# Patient Record
Sex: Male | Born: 1960 | Race: White | Hispanic: No | Marital: Married | Smoking: Never smoker
Health system: Southern US, Community
[De-identification: ages and names within clinical notes are randomized; demographics above are authoritative.]

## PROBLEM LIST (undated history)

## (undated) DIAGNOSIS — J45909 Unspecified asthma, uncomplicated: Secondary | ICD-10-CM

## (undated) DIAGNOSIS — E559 Vitamin D deficiency, unspecified: Secondary | ICD-10-CM

## (undated) DIAGNOSIS — J302 Other seasonal allergic rhinitis: Secondary | ICD-10-CM

## (undated) DIAGNOSIS — K219 Gastro-esophageal reflux disease without esophagitis: Secondary | ICD-10-CM

## (undated) DIAGNOSIS — R972 Elevated prostate specific antigen [PSA]: Secondary | ICD-10-CM

## (undated) DIAGNOSIS — T7840XA Allergy, unspecified, initial encounter: Secondary | ICD-10-CM

## (undated) HISTORY — DX: Allergy, unspecified, initial encounter: T78.40XA

## (undated) HISTORY — DX: Vitamin D deficiency, unspecified: E55.9

## (undated) HISTORY — DX: Elevated prostate specific antigen (PSA): R97.20

---
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
## 2007-05-23 ENCOUNTER — Ambulatory Visit: Payer: Self-pay | Admitting: Gastroenterology

## 2008-08-31 ENCOUNTER — Emergency Department: Payer: Self-pay | Admitting: Emergency Medicine

## 2011-12-07 ENCOUNTER — Ambulatory Visit: Payer: Self-pay

## 2011-12-07 LAB — RAPID INFLUENZA A&B ANTIGENS

## 2014-03-23 ENCOUNTER — Ambulatory Visit: Payer: Self-pay | Admitting: Gastroenterology

## 2014-12-23 ENCOUNTER — Ambulatory Visit
Admit: 2014-12-23 | Disposition: A | Discharge status: Home / Self Care | Payer: Self-pay | Attending: Family Medicine | Admitting: Family Medicine

## 2015-02-25 ENCOUNTER — Other Ambulatory Visit: Payer: Managed Care, Other (non HMO)

## 2015-02-25 ENCOUNTER — Other Ambulatory Visit: Payer: Self-pay | Admitting: Family Medicine

## 2015-02-25 DIAGNOSIS — E785 Hyperlipidemia, unspecified: Secondary | ICD-10-CM

## 2015-02-25 DIAGNOSIS — E559 Vitamin D deficiency, unspecified: Secondary | ICD-10-CM

## 2015-02-25 DIAGNOSIS — R972 Elevated prostate specific antigen [PSA]: Secondary | ICD-10-CM

## 2015-02-25 HISTORY — DX: Elevated prostate specific antigen (PSA): R97.20

## 2015-02-25 HISTORY — DX: Vitamin D deficiency, unspecified: E55.9

## 2015-02-26 ENCOUNTER — Encounter: Payer: Self-pay | Admitting: Family Medicine

## 2015-02-26 LAB — LIPID PANEL W/O CHOL/HDL RATIO
Cholesterol, Total: 195 mg/dL (ref 100–199)
HDL: 48 mg/dL (ref >39)
LDL Calculated: 129 mg/dL — ABNORMAL HIGH (ref 0–99)
Triglycerides: 90 mg/dL (ref 0–149)
VLDL CHOLESTEROL CAL: 18 mg/dL (ref 5–40)

## 2015-02-26 LAB — PSA: Prostate Specific Ag, Serum: 2.1 ng/mL (ref 0.0–4.0)

## 2015-02-26 LAB — VITAMIN D 25 HYDROXY (VIT D DEFICIENCY, FRACTURES): Vit D, 25-Hydroxy: 40.2 ng/mL (ref 30.0–100.0)

## 2016-08-07 ENCOUNTER — Encounter: Payer: Managed Care, Other (non HMO) | Admitting: Family Medicine

## 2016-09-07 ENCOUNTER — Encounter: Payer: Self-pay | Admitting: Family Medicine

## 2016-09-07 ENCOUNTER — Ambulatory Visit (INDEPENDENT_AMBULATORY_CARE_PROVIDER_SITE_OTHER): Payer: Managed Care, Other (non HMO) | Admitting: Family Medicine

## 2016-09-07 VITALS — BP 104/62 | HR 86 | Temp 97.6°F | Resp 14 | Ht 66.7 in | Wt 139.0 lb

## 2016-09-07 DIAGNOSIS — Z1159 Encounter for screening for other viral diseases: Secondary | ICD-10-CM

## 2016-09-07 DIAGNOSIS — Z0000 Encounter for general adult medical examination without abnormal findings: Secondary | ICD-10-CM | POA: Diagnosis not present

## 2016-09-07 DIAGNOSIS — E559 Vitamin D deficiency, unspecified: Secondary | ICD-10-CM | POA: Diagnosis not present

## 2016-09-07 DIAGNOSIS — R972 Elevated prostate specific antigen [PSA]: Secondary | ICD-10-CM | POA: Diagnosis not present

## 2016-09-07 DIAGNOSIS — Z114 Encounter for screening for human immunodeficiency virus [HIV]: Secondary | ICD-10-CM

## 2016-09-07 LAB — CBC WITH DIFFERENTIAL/PLATELET
BASOS PCT: 0 %
Basophils Absolute: 0 cells/uL (ref 0–200)
EOS ABS: 228 {cells}/uL (ref 15–500)
Eosinophils Relative: 3 %
HCT: 50.9 % — ABNORMAL HIGH (ref 38.5–50.0)
Hemoglobin: 17.2 g/dL — ABNORMAL HIGH (ref 13.2–17.1)
LYMPHS PCT: 19 %
Lymphs Abs: 1444 cells/uL (ref 850–3900)
MCH: 30.6 pg (ref 27.0–33.0)
MCHC: 33.8 g/dL (ref 32.0–36.0)
MCV: 90.4 fL (ref 80.0–100.0)
MONOS PCT: 5 %
MPV: 9.3 fL (ref 7.5–12.5)
Monocytes Absolute: 380 cells/uL (ref 200–950)
Neutro Abs: 5548 cells/uL (ref 1500–7800)
Neutrophils Relative %: 73 %
PLATELETS: 270 10*3/uL (ref 140–400)
RBC: 5.63 MIL/uL (ref 4.20–5.80)
RDW: 13.5 % (ref 11.0–15.0)
WBC: 7.6 10*3/uL (ref 3.8–10.8)

## 2016-09-07 NOTE — Patient Instructions (Addendum)
We'll get labs today If you have not heard anything from my staff in a week about any orders/referrals/studies from today, please contact us here to follow-up ([phone]) [phone]  Health Maintenance, Male A healthy lifestyle and preventative care can promote health and wellness.  Maintain regular health, dental, and eye exams.  Eat a healthy diet. Foods like vegetables, fruits, whole grains, low-fat dairy products, and lean protein foods contain the nutrients you need and are low in calories. Decrease your intake of foods high in solid fats, added sugars, and salt. Get information about a proper diet from your health care provider, if necessary.  Regular physical exercise is one of the most important things you can do for your health. Most adults should get at least 150 minutes of moderate-intensity exercise (any activity that increases your heart rate and causes you to sweat) each week. In addition, most adults need muscle-strengthening exercises on 2 or more days a week.   Maintain a healthy weight. The body mass index (BMI) is a screening tool to identify possible weight problems. It provides an estimate of body fat based on height and weight. Your health care provider can find your BMI and can help you achieve or maintain a healthy weight. For males 20 years and older:  A BMI below 18.5 is considered underweight.  A BMI of 18.5 to 24.9 is normal.  A BMI of 25 to 29.9 is considered overweight.  A BMI of 30 and above is considered obese.  Maintain normal blood lipids and cholesterol by exercising and minimizing your intake of saturated fat. Eat a balanced diet with plenty of fruits and vegetables. Blood tests for lipids and cholesterol should begin at age 38 and be repeated every 5 years. If your lipid or cholesterol levels are high, you are over age 90, or you are at high risk for heart disease, you may need your cholesterol levels checked more frequently.Ongoing high lipid and cholesterol  levels should be treated with medicines if diet and exercise are not working.  If you smoke, find out from your health care provider how to quit. If you do not use tobacco, do not start.  Lung cancer screening is recommended for adults aged 22-80 years who are at high risk for developing lung cancer because of a history of smoking. A yearly low-dose CT scan of the lungs is recommended for people who have at least a 30-pack-year history of smoking and are current smokers or have quit within the past 15 years. A pack year of smoking is smoking an average of 1 pack of cigarettes a day for 1 year (for example, a 30-pack-year history of smoking could mean smoking 1 pack a day for 30 years or 2 packs a day for 15 years). Yearly screening should continue until the smoker has stopped smoking for at least 15 years. Yearly screening should be stopped for people who develop a health problem that would prevent them from having lung cancer treatment.  If you choose to drink alcohol, do not have more than 2 drinks per day. One drink is considered to be 12 oz (360 mL) of beer, 5 oz (150 mL) of wine, or 1.5 oz (45 mL) of liquor.  Avoid the use of street drugs. Do not share needles with anyone. Ask for help if you need support or instructions about stopping the use of drugs.  High blood pressure causes heart disease and increases the risk of stroke. High blood pressure is more likely to develop in:  People who have blood pressure in the end of the normal range (100-139/85-89 mm Hg).  People who are overweight or obese.  People who are African American.  If you are 22-42 years of age, have your blood pressure checked every 3-5 years. If you are 78 years of age or older, have your blood pressure checked every year. You should have your blood pressure measured twice-once when you are at a hospital or clinic, and once when you are not at a hospital or clinic. Record the average of the two measurements. To check your  blood pressure when you are not at a hospital or clinic, you can use:  An automated blood pressure machine at a pharmacy.  A home blood pressure monitor.  If you are 46-41 years old, ask your health care provider if you should take aspirin to prevent heart disease.  Diabetes screening involves taking a blood sample to check your fasting blood sugar level. This should be done once every 3 years after age 59 if you are at a normal weight and without risk factors for diabetes. Testing should be considered at a younger age or be carried out more frequently if you are overweight and have at least 1 risk factor for diabetes.  Colorectal cancer can be detected and often prevented. Most routine colorectal cancer screening begins at the age of 34 and continues through age 1. However, your health care provider may recommend screening at an earlier age if you have risk factors for colon cancer. On a yearly basis, your health care provider may provide home test kits to check for hidden blood in the stool. A small camera at the end of a tube may be used to directly examine the colon (sigmoidoscopy or colonoscopy) to detect the earliest forms of colorectal cancer. Talk to your health care provider about this at age 34 when routine screening begins. A direct exam of the colon should be repeated every 5-10 years through age 52, unless early forms of precancerous polyps or small growths are found.  People who are at an increased risk for hepatitis B should be screened for this virus. You are considered at high risk for hepatitis B if:  You were born in a country where hepatitis B occurs often. Talk with your health care provider about which countries are considered high risk.  Your parents were born in a high-risk country and you have not received a shot to protect against hepatitis B (hepatitis B vaccine).  You have HIV or AIDS.  You use needles to inject street drugs.  You live with, or have sex with,  someone who has hepatitis B.  You are a man who has sex with other men (MSM).  You get hemodialysis treatment.  You take certain medicines for conditions like cancer, organ transplantation, and autoimmune conditions.  Hepatitis C blood testing is recommended for all people born from [birth year] through 1963 and any individual with known risk factors for hepatitis C.  Healthy men should no longer receive prostate-specific antigen (PSA) blood tests as part of routine cancer screening. Talk to your health care provider about prostate cancer screening.  Testicular cancer screening is not recommended for adolescents or adult males who have no symptoms. Screening includes self-exam, a health care provider exam, and other screening tests. Consult with your health care provider about any symptoms you have or any concerns you have about testicular cancer.  Practice safe sex. Use condoms and avoid high-risk sexual practices to reduce the spread of  sexually transmitted infections (STIs).  You should be screened for STIs, including gonorrhea and chlamydia if:  You are sexually active and are younger than 24 years.  You are older than 24 years, and your health care provider tells you that you are at risk for this type of infection.  Your sexual activity has changed since you were last screened, and you are at an increased risk for chlamydia or gonorrhea. Ask your health care provider if you are at risk.  If you are at risk of being infected with HIV, it is recommended that you take a prescription medicine daily to prevent HIV infection. This is called pre-exposure prophylaxis (PrEP). You are considered at risk if:  You are a man who has sex with other men (MSM).  You are a heterosexual man who is sexually active with multiple partners.  You take drugs by injection.  You are sexually active with a partner who has HIV.  Talk with your health care provider about whether you are at high risk of being  infected with HIV. If you choose to begin PrEP, you should first be tested for HIV. You should then be tested every 3 months for as long as you are taking PrEP.  Use sunscreen. Apply sunscreen liberally and repeatedly throughout the day. You should seek shade when your shadow is shorter than you. Protect yourself by wearing long sleeves, pants, a wide-brimmed hat, and sunglasses year round whenever you are outdoors.  Tell your health care provider of new moles or changes in moles, especially if there is a change in shape or color. Also, tell your health care provider if a mole is larger than the size of a pencil eraser.  A one-time screening for abdominal aortic aneurysm (AAA) and surgical repair of large AAAs by ultrasound is recommended for men aged 65-75 years who are current or former smokers.  Stay current with your vaccines (immunizations). This information is not intended to replace advice given to you by your health care provider. Make sure you discuss any questions you have with your health care provider. Document Released: 02/13/2008 Document Revised: 09/07/2014 Document Reviewed: 05/21/2015 Elsevier Interactive Patient Education  2017 [employer].

## 2016-09-07 NOTE — Assessment & Plan Note (Signed)
Check level 

## 2016-09-07 NOTE — Assessment & Plan Note (Signed)
Order PSA.

## 2016-09-07 NOTE — Assessment & Plan Note (Signed)
USPSTF grade A and B recommendations reviewed with patient; age-appropriate recommendations, preventive care, screening tests, etc discussed and encouraged; healthy living encouraged; see AVS for patient education given to patient  

## 2016-09-07 NOTE — Progress Notes (Signed)
Patient ID: Brandon Howell, male   DOB: 1961/04/30, 56 y.o.   MRN: [id number]   Subjective:   Brandon Howell is a 56 y.o. male here for a complete physical exam  Interim issues since last visit: had colonoscopy in 2015  USPSTF grade A and B recommendations Alcohol: moderation, under 14 Depression:  Depression screen [facility] 2/9 09/07/2016  Decreased Interest 0  Down, Depressed, Hopeless 0  PHQ - 2 Score 0   Hypertension: excellent control Obesity: no Tobacco use: never smoker HIV, hep B, hep C: ordered STD testing and prevention (chl/gon/syphilis):  Lipids: today Glucose: today Colorectal cancer: done in 2015 Breast cancer: no lumps Lung cancer: n/a Osteoporosis: n/a AAA: n/a  Aspirin: not taking but will consider; has them, never took them Diet: pretty good eater; avoid processed foods, no fast foods; no sodas Exercise: nothing regular, will try to increase Skin cancer: nothing worrisome  Cyst on left hip; superficial; had one on right knee that was removed by [facility], Dr. Nehemiah Massed; not bleeding or draining  Past Medical History:  Diagnosis Date  . Allergy   . PSA elevation 02/25/2015  . Vitamin D deficiency 02/25/2015   Past Surgical History:  Procedure Laterality Date  . EYE SURGERY Bilateral 08/31/1965   Family History  Problem Relation Age of Onset  . Adopted: Yes  . Thyroid disease Mother   . Hyperlipidemia Mother   . Allergic rhinitis Brother   . Allergic rhinitis Brother    Social History  Substance Use Topics  . Smoking status: Never Smoker  . Smokeless tobacco: Former Systems developer  . Alcohol use Yes     Comment: moderate, less than 14 per week   Review of Systems  Respiratory: Negative for shortness of breath.   Cardiovascular: Negative for chest pain.  Gastrointestinal: Negative for blood in stool.  Genitourinary: Negative for hematuria.    Objective:   Vitals:   09/07/16 0933  BP: 104/62  Pulse: 86  Resp: 14  Temp: 97.6 F (36.4  C)  TempSrc: Oral  SpO2: 96%  Weight: 139 lb (63 kg)  Height: 5' 6.7" (1.694 m)   Body mass index is 21.97 kg/m. Wt Readings from Last 3 Encounters:  09/07/16 139 lb (63 kg)   Physical Exam  Constitutional: He appears well-developed and well-nourished. No distress.  HENT:  Head: Normocephalic and atraumatic.  Nose: Nose normal.  Mouth/Throat: Oropharynx is clear and moist.  Eyes: EOM are normal. No scleral icterus.  Neck: No JVD present. No thyromegaly present.  Cardiovascular: Normal rate, regular rhythm and normal heart sounds.   Pulmonary/Chest: Effort normal and breath sounds normal. No respiratory distress. He has no wheezes. He has no rales.  Abdominal: Soft. Bowel sounds are normal. He exhibits no distension. There is no tenderness. There is no guarding.  Musculoskeletal: Normal range of motion. He exhibits no edema.  Lymphadenopathy:    He has no cervical adenopathy.  Neurological: He is alert. He displays normal reflexes. He exhibits normal muscle tone. Coordination normal.  Skin: Skin is warm and dry. He is not diaphoretic. No pallor.     Very soft papule lateral aspect of LEFT hip; consistency of lipoma; no overlying skin changes; about 1 cm across in diameter  Psychiatric: He has a normal mood and affect. His behavior is normal. Judgment and thought content normal. His mood appears not anxious. He does not exhibit a depressed mood.   Assessment/Plan:   Problem List Items Addressed This Visit  Other   Vitamin D deficiency    Check level      Relevant Orders   VITAMIN D 25 Hydroxy (Vit-D Deficiency, Fractures)   PSA elevation    Order PSA      Relevant Orders   PSA   Preventative health care    USPSTF grade A and B recommendations reviewed with patient; age-appropriate recommendations, preventive care, screening tests, etc discussed and encouraged; healthy living encouraged; see AVS for patient education given to patient       Relevant Orders    Lipid panel   CBC with Differential/Platelet   COMPLETE METABOLIC PANEL WITH GFR    Other Visit Diagnoses    Screening for HIV without presence of risk factors    -  Primary   Relevant Orders   HIV antibody (with reflex)   Encounter for hepatitis C screening test for low risk patient       Relevant Orders   Hepatitis C Antibody      No orders of the defined types were placed in this encounter.  Orders Placed This Encounter  Procedures  . HIV antibody (with reflex)  . Hepatitis C Antibody  . Lipid panel  . CBC with Differential/Platelet  . PSA  . VITAMIN D 25 Hydroxy (Vit-D Deficiency, Fractures)  . COMPLETE METABOLIC PANEL WITH GFR    Follow up plan: Return in about 1 year (around 09/07/2017) for complete physical.  An After Visit Summary was printed and given to the patient.

## 2016-09-08 LAB — COMPLETE METABOLIC PANEL WITH GFR
ALBUMIN: 4.7 g/dL (ref 3.6–5.1)
ALK PHOS: 65 U/L (ref 40–115)
ALT: 48 U/L — ABNORMAL HIGH (ref 9–46)
AST: 30 U/L (ref 10–35)
BUN: 18 mg/dL (ref 7–25)
CALCIUM: 9.2 mg/dL (ref 8.6–10.3)
CO2: 27 mmol/L (ref 20–31)
Chloride: 103 mmol/L (ref 98–110)
Creat: 0.9 mg/dL (ref 0.70–1.33)
GLUCOSE: 80 mg/dL (ref 65–99)
POTASSIUM: 4.3 mmol/L (ref 3.5–5.3)
SODIUM: 142 mmol/L (ref 135–146)
Total Bilirubin: 0.5 mg/dL (ref 0.2–1.2)
Total Protein: 7.4 g/dL (ref 6.1–8.1)

## 2016-09-08 LAB — LIPID PANEL
CHOL/HDL RATIO: 4.1 ratio (ref <5.0)
CHOLESTEROL: 224 mg/dL — AB (ref <200)
HDL: 55 mg/dL (ref >40)
LDL Cholesterol: 149 mg/dL — ABNORMAL HIGH (ref <100)
Triglycerides: 101 mg/dL (ref <150)
VLDL: 20 mg/dL (ref <30)

## 2016-09-08 LAB — PSA: PSA: 0.8 ng/mL (ref <=4.0)

## 2016-09-08 LAB — VITAMIN D 25 HYDROXY (VIT D DEFICIENCY, FRACTURES): Vit D, 25-Hydroxy: 52 ng/mL (ref 30–100)

## 2016-09-08 LAB — HIV ANTIBODY (ROUTINE TESTING W REFLEX): HIV 1&2 Ab, 4th Generation: NONREACTIVE

## 2016-09-08 LAB — HEPATITIS C ANTIBODY: HCV Ab: NEGATIVE

## 2016-09-09 ENCOUNTER — Other Ambulatory Visit: Payer: Self-pay | Admitting: Family Medicine

## 2016-09-09 DIAGNOSIS — R7401 Elevation of levels of liver transaminase levels: Secondary | ICD-10-CM

## 2016-09-09 DIAGNOSIS — E785 Hyperlipidemia, unspecified: Secondary | ICD-10-CM | POA: Insufficient documentation

## 2016-09-09 DIAGNOSIS — R740 Nonspecific elevation of levels of transaminase and lactic acid dehydrogenase [LDH]: Principal | ICD-10-CM

## 2016-09-09 DIAGNOSIS — R718 Other abnormality of red blood cells: Secondary | ICD-10-CM | POA: Insufficient documentation

## 2016-09-09 DIAGNOSIS — E782 Mixed hyperlipidemia: Secondary | ICD-10-CM

## 2016-09-09 NOTE — Progress Notes (Signed)
Elevated H/H; refer to pulm Mild elevation of SGPT only; low fat diet, recheck in 12 weeks Elevated LDL; recheck after 12 weeks of TLC

## 2016-09-09 NOTE — Assessment & Plan Note (Addendum)
Check labs in 12 weeks after TLC

## 2016-09-09 NOTE — Assessment & Plan Note (Signed)
Refer to pulm for possible sleep apnea; recheck in 12 weeks

## 2016-09-11 ENCOUNTER — Telehealth: Payer: Self-pay | Admitting: Family Medicine

## 2016-09-11 NOTE — Telephone Encounter (Signed)
Called patient back regarding  his physical. He was really concern about his A!C being little elevated. He would like to know to know what could cause this because he has never had any issue like this before. I mention to patient that you are out on vacation and will not be in the office until next wednesday. Patient stated that he would like to get his lab results mailed to him. Print them out and sent them to him.

## 2016-09-11 NOTE — Telephone Encounter (Signed)
Pt would like a call back about his CPE. You can reach him on his cell but he also ask for you to call his work # in case his cell is not working correctly. Work is [phone] Ext is [phone]. Please return his call.

## 2016-09-16 NOTE — Telephone Encounter (Signed)
I did not check an A1c, so I'm confused; his glucose is normal I tried to call the patient, what lab he is actually concerned about, but had to leave voicemail I'll try him again later

## 2016-09-18 NOTE — Telephone Encounter (Signed)
I left msg on cell phone I called work number, left msg

## 2016-09-24 NOTE — Telephone Encounter (Signed)
I called cell phone, left msg I called work number, left msg

## 2016-09-24 NOTE — Telephone Encounter (Signed)
I left detailed message at work; I've tried to reach out to him but have been unsuccessful; I'm not sure how I can help him or what question he has, but I asked him to reach out to me through [place] or call back if he has any question I can answer; I'm closing this note

## 2016-11-06 ENCOUNTER — Telehealth: Payer: Self-pay | Admitting: Family Medicine

## 2016-11-06 NOTE — Telephone Encounter (Signed)
Pt called back and stated that he gave the wrong number [phone]

## 2016-11-06 NOTE — Telephone Encounter (Signed)
Tried to call patient back phone disconnected

## 2016-11-06 NOTE — Telephone Encounter (Signed)
Pt returning your call (c) [phone]

## 2016-11-20 ENCOUNTER — Telehealth: Payer: Self-pay | Admitting: Family Medicine

## 2016-11-20 DIAGNOSIS — R718 Other abnormality of red blood cells: Secondary | ICD-10-CM

## 2016-11-20 MED ORDER — FLUTICASONE PROPIONATE 50 MCG/ACT NA SUSP: 2.0000 | Freq: Every day | NASAL | 3 refills | Status: DC

## 2016-11-20 NOTE — Addendum Note (Signed)
Addended by: Toniette Devera, Satira Anis on: 11/20/2016 05:32 PM   Modules accepted: Orders

## 2016-11-20 NOTE — Telephone Encounter (Signed)
Pt states that you guys have been playing phone tag and is requesting return call from Dr Sanda Klein [phone]. He is on vacation and is able to talk at your earliest convience

## 2016-11-20 NOTE — Telephone Encounter (Signed)
I returned his call He was concerned about something at last physical H/H was elevated and he was referred to pulmonologist Only getting about 4.5 hours of sleep a night; wondered if that could be related to the H/H Sinuses are completed closed He wanted to talk about the pulm first; has not gone Wife got him a fit bit and he is analyzing sleep; analyzing sleep patterns; getting 20-30% deep sleep Untreated sleep apnea can be fatal I explained He'd like to try nasal spray, recheck CBC in 1-2 months He is already on a healthy diet, no fast food, no saturated fats, oatmeal every morning; he thinks maybe the 149 LDL was right after the holidays He is curious to see how it is next time Discussed SGPT barely elevated; not a big worry; may be some fatty liver, maybe just outside normal range and healthy; just recheck; he's not a drinker, no tylenol

## 2016-11-20 NOTE — Telephone Encounter (Signed)
errernous

## 2016-11-20 NOTE — Assessment & Plan Note (Signed)
Check CBC after 1-2 months on nasal spray

## 2016-12-06 ENCOUNTER — Emergency Department: Payer: Managed Care, Other (non HMO)

## 2016-12-06 ENCOUNTER — Emergency Department
Admission: EM | Admit: 2016-12-06 | Discharge: 2016-12-06 | Disposition: A | Discharge status: Home / Self Care | Payer: Managed Care, Other (non HMO) | Attending: Emergency Medicine | Admitting: Emergency Medicine

## 2016-12-06 DIAGNOSIS — Y999 Unspecified external cause status: Secondary | ICD-10-CM | POA: Insufficient documentation

## 2016-12-06 DIAGNOSIS — Y929 Unspecified place or not applicable: Secondary | ICD-10-CM | POA: Diagnosis not present

## 2016-12-06 DIAGNOSIS — Y939 Activity, unspecified: Secondary | ICD-10-CM | POA: Insufficient documentation

## 2016-12-06 DIAGNOSIS — W08XXXA Fall from other furniture, initial encounter: Secondary | ICD-10-CM | POA: Diagnosis not present

## 2016-12-06 DIAGNOSIS — S99921A Unspecified injury of right foot, initial encounter: Secondary | ICD-10-CM

## 2016-12-06 DIAGNOSIS — Z87891 Personal history of nicotine dependence: Secondary | ICD-10-CM | POA: Insufficient documentation

## 2016-12-06 DIAGNOSIS — S92001A Unspecified fracture of right calcaneus, initial encounter for closed fracture: Secondary | ICD-10-CM | POA: Insufficient documentation

## 2016-12-06 MED ORDER — TRAMADOL HCL 50 MG PO TABS
ORAL_TABLET | ORAL | Status: AC
  Administered 2016-12-06: 50 mg via ORAL
  Filled 2016-12-06: qty 1

## 2016-12-06 MED ORDER — TRAMADOL HCL 50 MG PO TABS: 50.0000 mg | ORAL_TABLET | Freq: Four times a day (QID) | ORAL | 0 refills | Status: DC | PRN

## 2016-12-06 MED ORDER — IBUPROFEN 600 MG PO TABS: 600.0000 mg | ORAL_TABLET | Freq: Four times a day (QID) | ORAL | 0 refills | Status: DC | PRN

## 2016-12-06 MED ORDER — IBUPROFEN 600 MG PO TABS
600.0000 mg | ORAL_TABLET | Freq: Once | ORAL | Status: AC
  Administered 2016-12-06: 600 mg via ORAL
  Filled 2016-12-06: qty 1

## 2016-12-06 MED ORDER — TRAMADOL HCL 50 MG PO TABS
50.0000 mg | ORAL_TABLET | Freq: Once | ORAL | Status: AC
Start: 2016-12-06 — End: 2016-12-06
  Administered 2016-12-06: 50 mg via ORAL

## 2016-12-06 NOTE — ED Provider Notes (Signed)
[facility] Emergency Department Provider Note ____________________________________________  Time seen: Approximately 5:55 PM  I have reviewed the triage vital signs and the nursing notes.   HISTORY  Chief Complaint Fall and Ankle Injury    HPI Brandon Howell is a 56 y.o. male who presents to the emergency department for evaluation of ankle pain after a mechanical, non-syncopal fall. He fell from a metal stand that was approximately 12 inches off the ground, landed on concrete, and rolled his right ankle.Severe pain in the right ankle and foot. He has not taken anything for pain since the incident.   Past Medical History:  Diagnosis Date  . Allergy   . PSA elevation 02/25/2015  . Vitamin D deficiency 02/25/2015    Patient Active Problem List   Diagnosis Date Noted  . Elevated serum glutamic pyruvic transaminase (SGPT) level 09/09/2016  . Hyperlipidemia 09/09/2016  . Elevated hematocrit 09/09/2016  . Preventative health care 09/07/2016  . Vitamin D deficiency 02/25/2015  . PSA elevation 02/25/2015    Past Surgical History:  Procedure Laterality Date  . EYE SURGERY Bilateral 08/31/1965    Prior to Admission medications   Medication Sig Start Date End Date Taking? Authorizing Provider  fluticasone (FLONASE) 50 MCG/ACT nasal spray Place 2 sprays into both nostrils daily. 11/20/16   Arnetha Courser, MD  ibuprofen (ADVIL,MOTRIN) 600 MG tablet Take 1 tablet (600 mg total) by mouth every 6 (six) hours as needed. 12/06/16   Victorino Dike, FNP  traMADol (ULTRAM) 50 MG tablet Take 1 tablet (50 mg total) by mouth every 6 (six) hours as needed. 12/06/16   Victorino Dike, FNP    Allergies Patient has no known allergies.  Family History  Problem Relation Age of Onset  . Adopted: Yes  . Thyroid disease Mother   . Hyperlipidemia Mother   . Allergic rhinitis Brother   . Allergic rhinitis Brother     Social History Social History  Substance Use Topics  .  Smoking status: Never Smoker  . Smokeless tobacco: Former Systems developer  . Alcohol use Yes     Comment: moderate, less than 14 per week    Review of Systems Constitutional: No recent illness. Cardiovascular: Denies chest pain or palpitations. Respiratory: Denies shortness of breath. Musculoskeletal: Pain in right foot and ankle. Skin: Negative for rash, wound, lesion. Positive for contusion. Neurological: Negative for focal weakness or numbness.  ____________________________________________   PHYSICAL EXAM:  VITAL SIGNS: ED Triage Vitals  Enc Vitals Group     BP 12/06/16 1719 (!) 145/82     Pulse Rate 12/06/16 1719 77     Resp 12/06/16 1719 20     Temp 12/06/16 1719 98.2 F (36.8 C)     Temp Source 12/06/16 1719 Oral     SpO2 12/06/16 1719 97 %     Weight 12/06/16 1720 142 lb (64.4 kg)     Height 12/06/16 1720 5\' 7"  (1.702 m)     Head Circumference --      Peak Flow --      Pain Score 12/06/16 1719 9     Pain Loc --      Pain Edu? --      Excl. in [place]? --     Constitutional: Alert and oriented. Well appearing and in no acute distress. Eyes: Conjunctivae are normal. EOMI. Head: Atraumatic. Neck: No stridor.  Respiratory: Normal respiratory effort.   Musculoskeletal: Focal tenderness over the dorsolateral aspect of the right foot. [place] ankle  rules are negative. Full ROM of toes of the right foot.  Neurologic:  Normal speech and language. No gross focal neurologic deficits are appreciated. Speech is normal. No gait instability. Skin:  Skin is warm, dry and intact. Atraumatic. Psychiatric: Mood and affect are normal. Speech and behavior are normal.  ____________________________________________   LABS (all labs ordered are listed, but only abnormal results are displayed)  Labs Reviewed - No data to display ____________________________________________  RADIOLOGY  Subtle lucency over the anterior lateral process of the calcaneus of the right foot. There is also an area of  sclerosis along the plantar dorsal aspect of the calcaneus.  There is a comminuted intra-articular fracture of the cuboid that extends into the calcaneocuboid articulation along the plantar as well as cephalad aspect. The medial fracture fragments are displaced off the cuboid posteriorly. There is slight widening of the medial aspect of the calcaneocuboid articulation due to the displaced fractures. A fracture of the anterior lateral process of the calcaneus is noted without dislocation. There is also a tiny cortical avulsion off the plantar lateral aspect of the posterior calcaneus. No additional midfoot fractures and the Lisfranc joint appears grossly intact. Ankle and subtalar joints are maintained. ____________________________________________   PROCEDURES  Procedure(s) performed: Posterior OCL extending from the testis at the midcalf applied by RN. Patient neurovascularly intact post-application.  ____________________________________________   INITIAL IMPRESSION / ASSESSMENT AND PLAN / ED COURSE  56 year old male presenting to the emergency department for evaluation of right foot and ankle pain after falling approximately 12 inches off of the metal stand and landed on concrete. Initial x-ray is concerning for calcaneal fracture which would be consistent with his exam. Further evaluation with CT to be completed. Ibuprofen ordered for pain. Patient does not want anything stronger at this time.  ----------------------------------------- 9:18 PM on 12/06/2016 -----------------------------------------  Patient to be discharged home with strict instructions to call and schedule an appointment with podiatry tomorrow. He is to remain nonweightbearing and use the crutches for any attempted ambulation. He was given a prescription for ibuprofen and tramadol. He was strongly advised to return to the emergency department for any symptom that changes or worsens if he is unable to his primary care provider  or the podiatrist.  Pertinent labs & imaging results that were available during my care of the patient were reviewed by me and considered in my medical decision making (see chart for details).  _________________________________________   FINAL CLINICAL IMPRESSION(S) / ED DIAGNOSES  Final diagnoses:  Injury of foot, right, initial encounter  Closed displaced fracture of right calcaneus, unspecified portion of calcaneus, initial encounter    New Prescriptions   IBUPROFEN (ADVIL,MOTRIN) 600 MG TABLET    Take 1 tablet (600 mg total) by mouth every 6 (six) hours as needed.   TRAMADOL (ULTRAM) 50 MG TABLET    Take 1 tablet (50 mg total) by mouth every 6 (six) hours as needed.    If controlled substance prescribed during this visit, 12 month history viewed on the [place] prior to issuing an initial prescription for Schedule II or III opiod.    Victorino Dike, FNP 12/06/16 2119    Delman Kitten, MD 12/06/16 [phone]

## 2016-12-06 NOTE — ED Notes (Signed)
Splint applied to rt. Ankle.  Pt. States ankle feels a lot better.

## 2016-12-06 NOTE — Discharge Instructions (Signed)
Please call and schedule an appointment with podiatry. Do not remove the cast. Take the medication as prescribed. Return to the ER for symptoms that change or worsen or for new concerns if you are unable to see your primary care provider or the podiatrist.

## 2016-12-06 NOTE — ED Triage Notes (Signed)
Pt presents to ED c/o fall from a metal stand 12 inches high, rolled ankle and fell.

## 2017-09-24 IMAGING — DX DG ANKLE COMPLETE 3+V*R*
3 series · 3 of 3 positions shown · non-contrast
Comparison: None.

CLINICAL DATA: Fell from a metal standing 12 inches high rolling
the ankle.

EXAM:
RIGHT ANKLE - COMPLETE 3+ VIEW

[ankle ap]
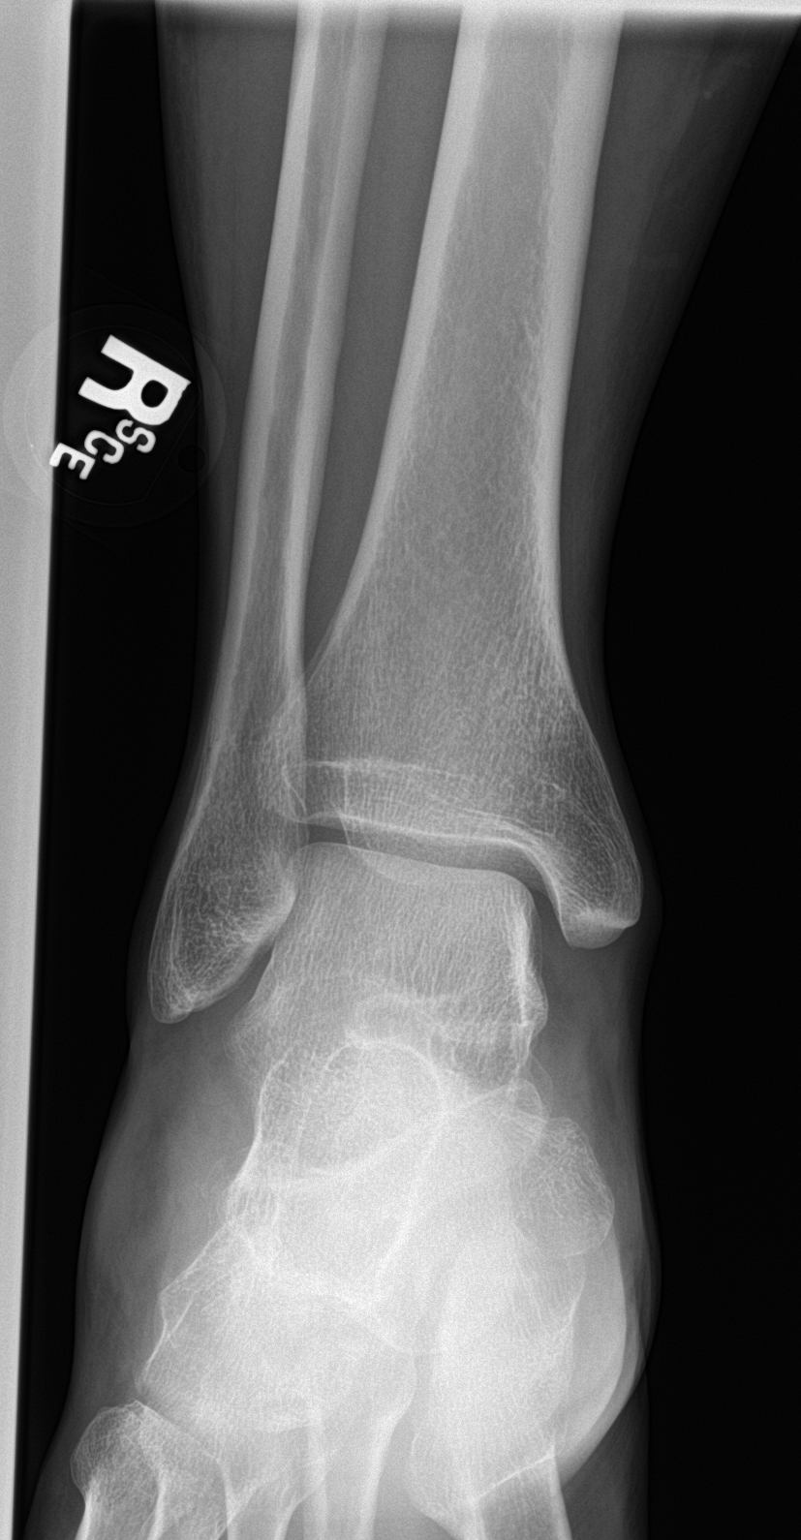

[ankle obl]
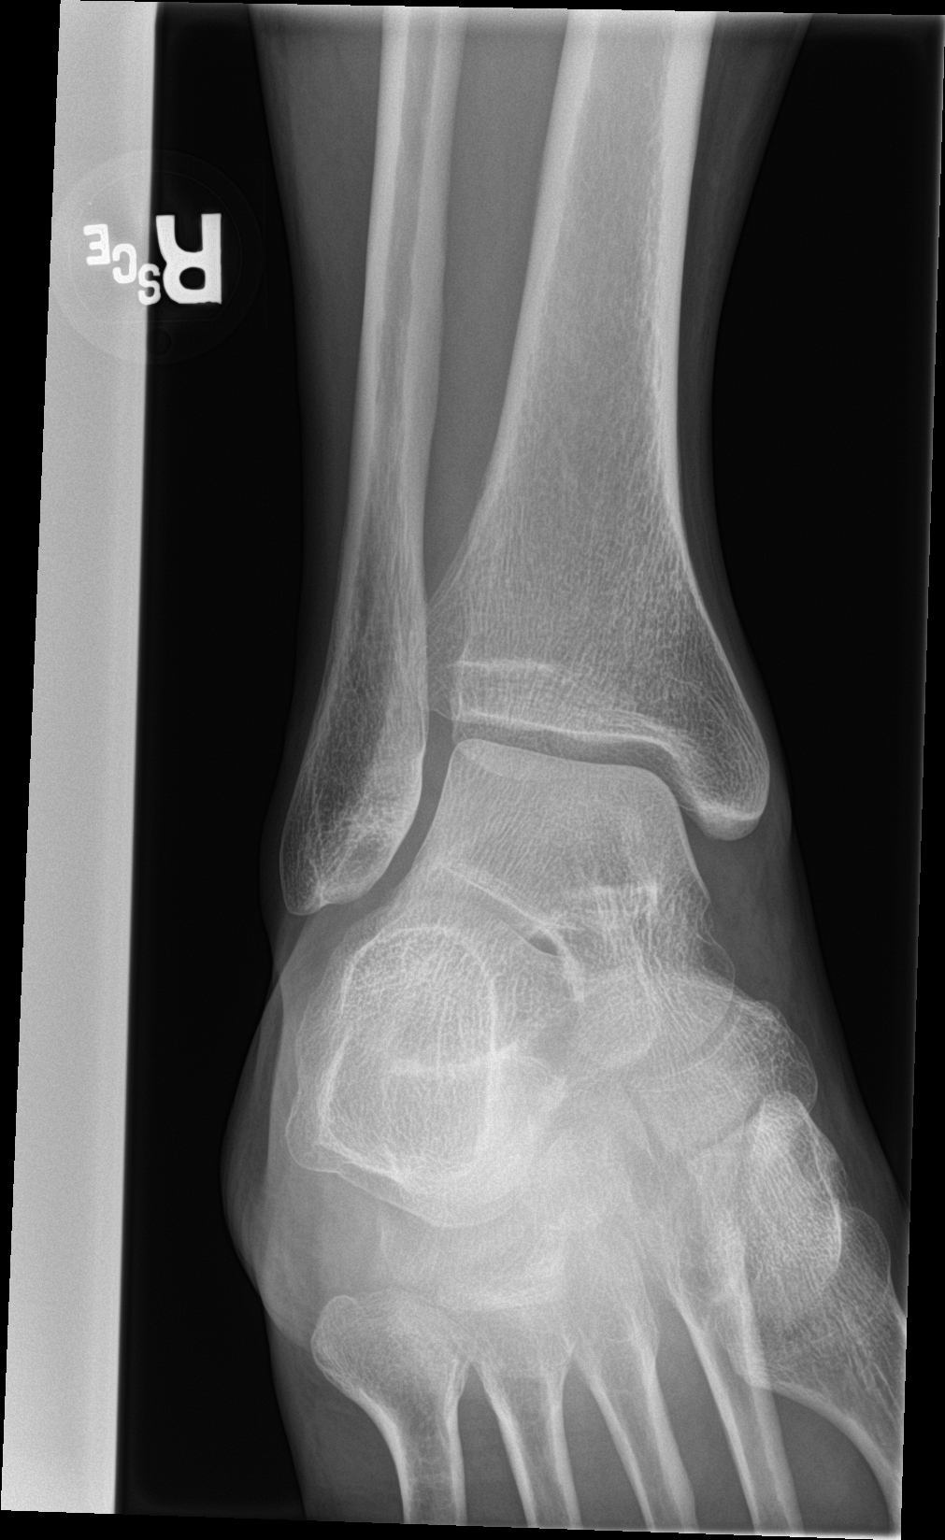

[ankle lat]
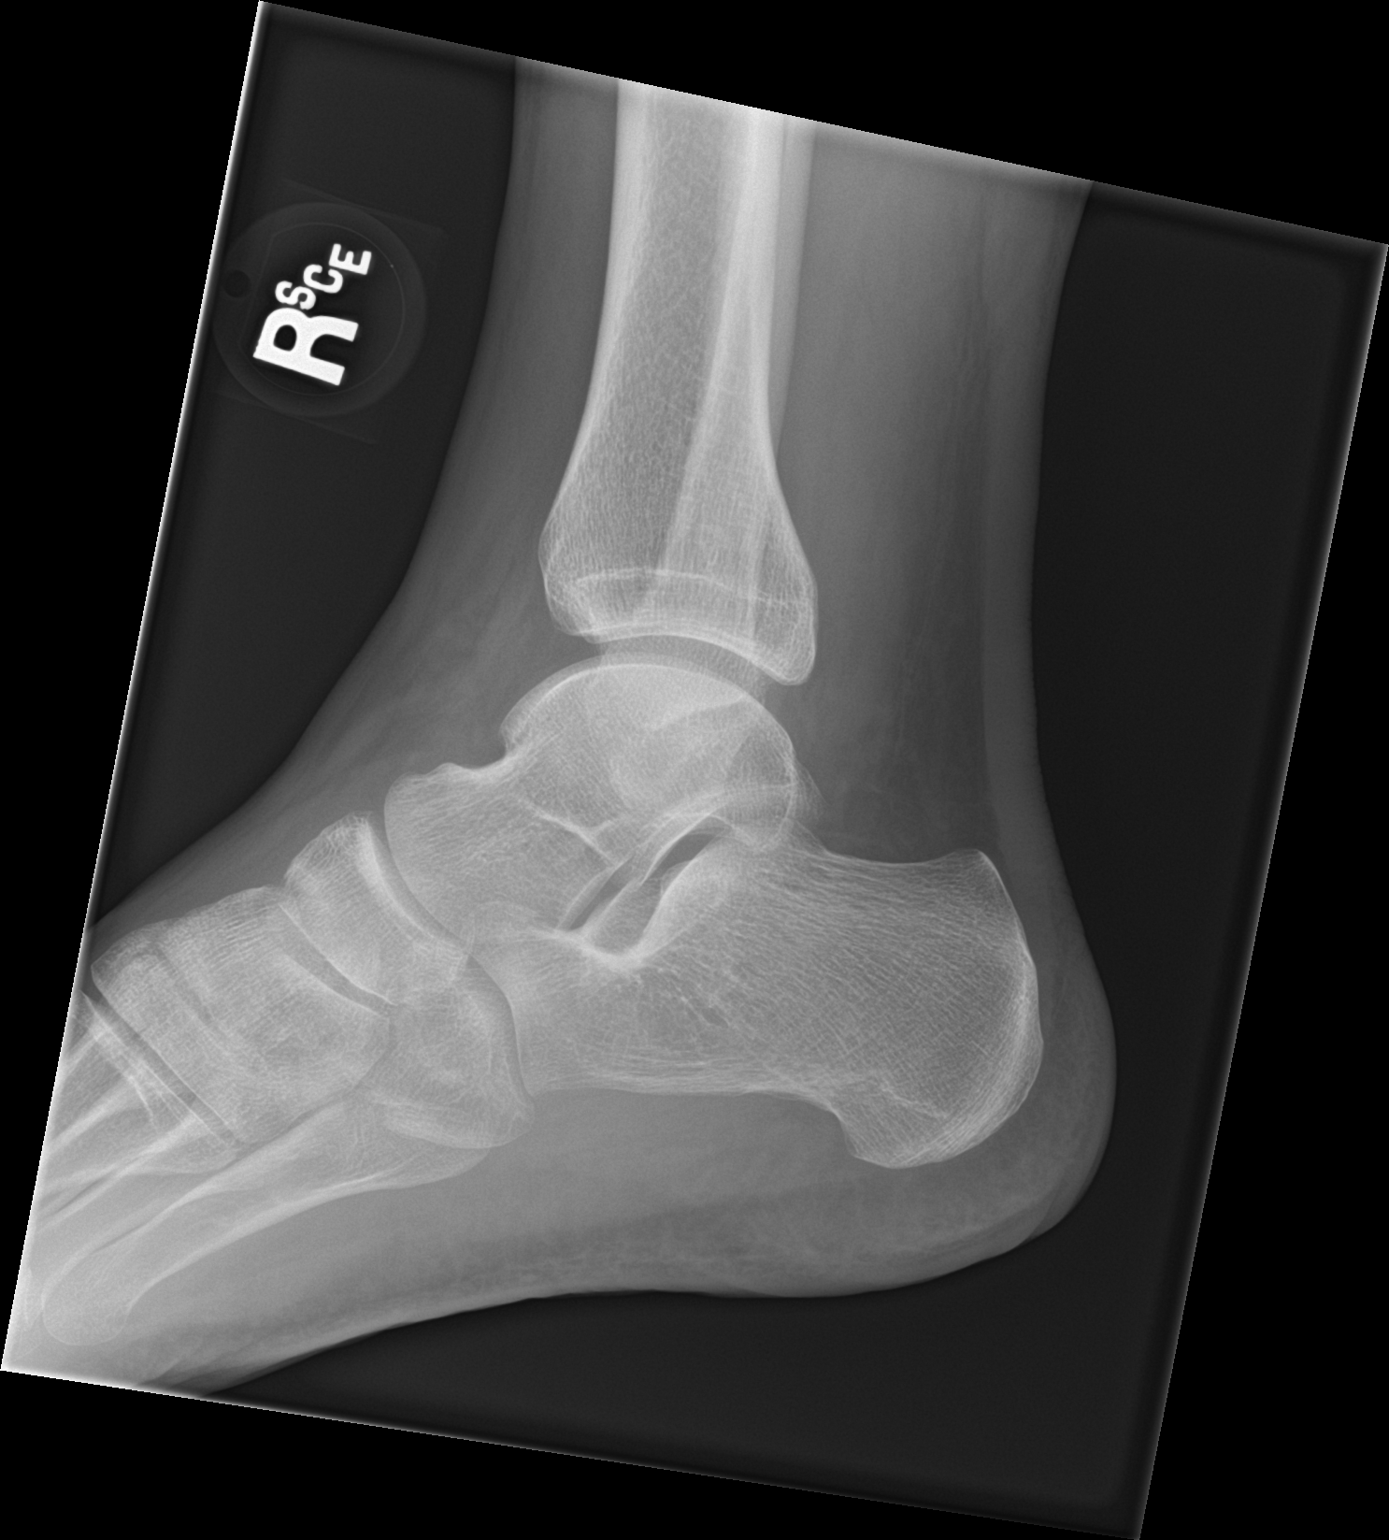

[3 of 3 positions shown; findings below may reference images not displayed]

FINDINGS: Mild soft tissue swelling about the ankle joint. The ankle mortise
is maintained. Linear sclerosis along the plantar dorsal aspect of
the calcaneus may represent a chronic calcaneal stress fracture.
Subtle lucency is suggested along the mid portion of the
anterolateral process of the calcaneus. A nondisplaced fracture is
not excluded.
IMPRESSION: Subtle lucency involving the anterolateral process of the calcaneus
is suggested on the lateral view. A nondisplaced fracture is not
excluded. CT may help for further correlation.

A zone of sclerosis along the plantar dorsal aspect of the calcaneus
is noted which can be seen with chronic calcaneal stress fractures
or changes from mild impaction.

Mild soft swelling about the ankle.

## 2017-11-22 ENCOUNTER — Telehealth: Payer: Self-pay | Admitting: Family Medicine

## 2017-11-22 NOTE — Telephone Encounter (Signed)
Wife concerned about his long hours; getting home after 8 or even 9 pm Wrapping stuff up; may have ADHD; can lose an hour or hours; coming home late Does not like to take medicine Wife interested in seeing counselor through [place], [place] somebody like Lanny Hurst Encouraged appointment [facility] Wife will have him schedule physical

## 2018-02-02 ENCOUNTER — Telehealth: Payer: Self-pay | Admitting: Family Medicine

## 2018-02-02 DIAGNOSIS — Z0000 Encounter for general adult medical examination without abnormal findings: Secondary | ICD-10-CM

## 2018-02-02 DIAGNOSIS — R972 Elevated prostate specific antigen [PSA]: Secondary | ICD-10-CM

## 2018-02-02 NOTE — Telephone Encounter (Signed)
Copied from [place] [id number]. Topic: Quick Communication - See Telephone Encounter >> Feb 02, 2018  4:52 PM Rutherford Nail, NT wrote: CRM for notification. See Telephone encounter for: 02/02/18. Patient is requesting his cpe labs be done the morning of his cpe (02/04/18). Please call patient and advise on whether this is possible or not.  CB#: [phone]

## 2018-02-03 NOTE — Telephone Encounter (Signed)
Orders printed; that's fine

## 2018-02-03 NOTE — Assessment & Plan Note (Signed)
Check PSA. ?

## 2018-02-03 NOTE — Telephone Encounter (Signed)
Called and informed patient that he can come prior to appointment to have labs done and that order has already been printed.  Patient verbalized understanding and will come in the morning.

## 2018-02-03 NOTE — Assessment & Plan Note (Signed)
Orders for labs

## 2018-02-03 NOTE — Telephone Encounter (Signed)
Patient checking status.

## 2018-02-04 ENCOUNTER — Telehealth: Payer: Self-pay | Admitting: Family Medicine

## 2018-02-04 ENCOUNTER — Ambulatory Visit (INDEPENDENT_AMBULATORY_CARE_PROVIDER_SITE_OTHER): Payer: Managed Care, Other (non HMO) | Admitting: Family Medicine

## 2018-02-04 ENCOUNTER — Encounter: Payer: Self-pay | Admitting: Family Medicine

## 2018-02-04 VITALS — BP 108/72 | HR 99 | Temp 98.3°F | Resp 14 | Ht 65.5 in | Wt 139.7 lb

## 2018-02-04 DIAGNOSIS — Z23 Encounter for immunization: Secondary | ICD-10-CM | POA: Diagnosis not present

## 2018-02-04 DIAGNOSIS — Z0000 Encounter for general adult medical examination without abnormal findings: Secondary | ICD-10-CM | POA: Diagnosis not present

## 2018-02-04 DIAGNOSIS — N138 Other obstructive and reflux uropathy: Secondary | ICD-10-CM

## 2018-02-04 DIAGNOSIS — N401 Enlarged prostate with lower urinary tract symptoms: Secondary | ICD-10-CM

## 2018-02-04 NOTE — Telephone Encounter (Signed)
Brandon Howell, can you please update HM list and contact patient with this info? Thank you

## 2018-02-04 NOTE — Telephone Encounter (Signed)
-----   Message from [place]ie [place], [place] sent at 02/04/2018  3:50 PM EDT ----- Regarding: RE: when is next colonoscopy due? Hi Dr. Sanda Klein! I am doing great! I hope you are as well. This pt's last colonoscopy was 03/23/2014. He was advised to repeat in 5 years.   Take care!  Ginger Feldpausch ----- Message ----- From: Arnetha Courser, MD Sent: 02/04/2018   2:12 PM To: Glennie [place], CMA Subject: when is next colonoscopy due?                  Greetings! Hope you are well. I'm seeing our mutual patient. Is our patient's next colonoscopy due 5 or 10 years from last? Thank you

## 2018-02-04 NOTE — Progress Notes (Signed)
Patient ID: Brandon Howell, male   DOB: 16-Feb-1961, 57 y.o.   MRN: [id number]   Subjective:   Brandon Howell is a 57 y.o. male here for a complete physical exam  Interim issues since last visit: no medical excitement; had a really bad cold; gets flu shots  USPSTF grade A and B recommendations Depression:  Depression screen [facility] 2/9 02/04/2018 02/04/2018 09/07/2016  Decreased Interest 0 0 0  Down, Depressed, Hopeless 0 1 0  PHQ - 2 Score 0 1 0  Altered sleeping 0 - -  Tired, decreased energy 0 - -  Change in appetite 0 - -  Feeling bad or failure about yourself  0 - -  Trouble concentrating 0 - -  Moving slowly or fidgety/restless 0 - -  Suicidal thoughts 0 - -  PHQ-9 Score 0 - -  Difficult doing work/chores Not difficult at all - -   Hypertension: BP Readings from Last 3 Encounters:  02/04/18 108/72  12/06/16 (!) 125/91  09/07/16 104/62   Obesity: Wt Readings from Last 3 Encounters:  02/04/18 139 lb 11.2 oz (63.4 kg)  12/06/16 142 lb (64.4 kg)  09/07/16 139 lb (63 kg)   BMI Readings from Last 3 Encounters:  02/04/18 22.89 kg/m  12/06/16 22.24 kg/m  09/07/16 21.97 kg/m    Immunizations: flu and tetanus UTD; discussed new shingles vaccine Skin cancer: nothing worrisome Lung cancer:  nonsmoker Prostate cancer: hx of elevated PSA; no one in the family had it; maternal grandfather died at age 50 maybe a stroke  Lab Results  Component Value Date   PSA 0.8 09/07/2016   Colorectal cancer: 2015 AAA: n/a Aspirin: 4% risk per aspirin guide, not advised Diet: good eater; eating even healthier now that wife is on a diet; chicken and veggies; oatmeal every morning; avoiding processed food Exercise: not good enough; sit at a desk a lot; gets lots of steps, 8k a day usually Alcohol: rare Tobacco use: never HIV, hep B, hep C: already done STD testing and prevention (chl/gon/syphilis): not interested Glucose:  Glucose, Bld  Date Value Ref Range Status  09/07/2016 80 65 -  99 mg/dL Final   Lipids:  Lab Results  Component Value Date   CHOL 224 (H) 09/07/2016   CHOL 195 02/25/2015   Lab Results  Component Value Date   HDL 55 09/07/2016   HDL 48 02/25/2015   Lab Results  Component Value Date   LDLCALC 149 (H) 09/07/2016   LDLCALC 129 (H) 02/25/2015   Lab Results  Component Value Date   TRIG 101 09/07/2016   TRIG 90 02/25/2015   Lab Results  Component Value Date   CHOLHDL 4.1 09/07/2016   No results found for: LDLDIRECT   Past Medical History:  Diagnosis Date  . Allergy   . PSA elevation 02/25/2015  . Vitamin D deficiency 02/25/2015   Past Surgical History:  Procedure Laterality Date  . EYE SURGERY Bilateral 08/31/1965   Family History  Adopted: Yes  Problem Relation Age of Onset  . Thyroid disease Mother   . Hyperlipidemia Mother   . Allergic rhinitis Brother   . Allergic rhinitis Brother    Social History   Tobacco Use  . Smoking status: Never Smoker  . Smokeless tobacco: Former Network engineer Use Topics  . Alcohol use: Yes    Comment: occasional  . Drug use: No   Review of Systems  Constitutional: Negative for unexpected weight change.  HENT: Negative for hearing loss.  Eyes: Negative for visual disturbance.  Gastrointestinal: Negative for blood in stool.  Endocrine: Negative for polydipsia.  Genitourinary: Negative for hematuria.  Allergic/Immunologic: Negative for food allergies.  Neurological: Negative for tremors.  Hematological: Negative for adenopathy.  Psychiatric/Behavioral: Negative for dysphoric mood.    Objective:   Vitals:   02/04/18 1356  BP: 108/72  Pulse: 99  Resp: 14  Temp: 98.3 F (36.8 C)  TempSrc: Oral  SpO2: 95%  Weight: 139 lb 11.2 oz (63.4 kg)  Height: 5' 5.5" (1.664 m)   Body mass index is 22.89 kg/m. Wt Readings from Last 3 Encounters:  02/04/18 139 lb 11.2 oz (63.4 kg)  12/06/16 142 lb (64.4 kg)  09/07/16 139 lb (63 kg)   Physical Exam  Constitutional: He appears  well-developed and well-nourished. No distress.  HENT:  Head: Normocephalic and atraumatic.  Nose: Nose normal.  Mouth/Throat: Oropharynx is clear and moist.  Eyes: EOM are normal. No scleral icterus.  Neck: No JVD present. No thyromegaly present.  Cardiovascular: Normal rate, regular rhythm and normal heart sounds.  Pulmonary/Chest: Effort normal and breath sounds normal. No respiratory distress. He has no wheezes. He has no rales.  Abdominal: Soft. Bowel sounds are normal. He exhibits no distension. There is no tenderness. There is no guarding.  Genitourinary: Rectal exam shows no mass, no tenderness and guaiac negative stool. Prostate is enlarged and tender (very mild).  Musculoskeletal: Normal range of motion. He exhibits no edema.  Lymphadenopathy:    He has no cervical adenopathy.  Neurological: He is alert. He displays normal reflexes. He exhibits normal muscle tone. Coordination normal.  Skin: Skin is warm and dry. No rash noted. He is not diaphoretic. No erythema. No pallor.  Psychiatric: He has a normal mood and affect. His behavior is normal. Judgment and thought content normal.    Assessment/Plan:   Problem List Items Addressed This Visit      Other   Preventative health care - Primary    USPSTF grade A and B recommendations reviewed with patient; age-appropriate recommendations, preventive care, screening tests, etc discussed and encouraged; healthy living encouraged; see AVS for patient education given to patient        Other Visit Diagnoses    Need for shingles vaccine       Relevant Orders   Varicella-zoster vaccine IM (Shingrix) (Completed)   BPH with obstruction/lower urinary tract symptoms       Relevant Orders   Urinalysis w microscopic + reflex cultur      No orders of the defined types were placed in this encounter.  Orders Placed This Encounter  Procedures  . Varicella-zoster vaccine IM (Shingrix)  . Urinalysis w microscopic + reflex cultur     Follow up plan: Return in about 3 months (around 05/07/2018) for Shingrix with CMA; one year for physical.  An After Visit Summary was printed and given to the patient.

## 2018-02-04 NOTE — Patient Instructions (Addendum)
Consider getting the new shingles vaccine called Shingrix; that is available for individuals 55 years of age and older, and is recommended even if you have had shingles in the past and/or already received the old shingles vaccine (Zostavax); it is a two-part series, and is available at many local pharmacies You received your first one today Return in 2-6 months for the booster of Shingrix You can try saw palmetto for prostate health We'll see what your labs show and contact you If you have not heard anything from my staff in a week about any orders/referrals/studies from today, please contact us here to follow-up [phone]   Health Maintenance, Male A healthy lifestyle and preventive care is important for your health and wellness. Ask your health care provider about what schedule of regular examinations is right for you. What should I know about weight and diet? Eat a Healthy Diet  Eat plenty of vegetables, fruits, whole grains, low-fat dairy products, and lean protein.  Do not eat a lot of foods high in solid fats, added sugars, or salt.  Maintain a Healthy Weight Regular exercise can help you achieve or maintain a healthy weight. You should:  Do at least 150 minutes of exercise each week. The exercise should increase your heart rate and make you sweat (moderate-intensity exercise).  Do strength-training exercises at least twice a week.  Watch Your Levels of Cholesterol and Blood Lipids  Have your blood tested for lipids and cholesterol every 5 years starting at 55 years of age. If you are at high risk for heart disease, you should start having your blood tested when you are 55 years old. You may need to have your cholesterol levels checked more often if: ? Your lipid or cholesterol levels are high. ? You are older than 55 years of age. ? You are at high risk for heart disease.  What should I know about cancer screening? Many types of cancers can be detected early and may often  be prevented. Lung Cancer  You should be screened every year for lung cancer if: ? You are a current smoker who has smoked for at least 30 years. ? You are a former smoker who has quit within the past 15 years.  Talk to your health care provider about your screening options, when you should start screening, and how often you should be screened.  Colorectal Cancer  Routine colorectal cancer screening usually begins at 55 years of age and should be repeated every 5-10 years until you are 55 years old. You may need to be screened more often if early forms of precancerous polyps or small growths are found. Your health care provider may recommend screening at an earlier age if you have risk factors for colon cancer.  Your health care provider may recommend using home test kits to check for hidden blood in the stool.  A small camera at the end of a tube can be used to examine your colon (sigmoidoscopy or colonoscopy). This checks for the earliest forms of colorectal cancer.  Prostate and Testicular Cancer  Depending on your age and overall health, your health care provider may do certain tests to screen for prostate and testicular cancer.  Talk to your health care provider about any symptoms or concerns you have about testicular or prostate cancer.  Skin Cancer  Check your skin from head to toe regularly.  Tell your health care provider about any new moles or changes in moles, especially if: ? There is a change  in a mole's size, shape, or color. ? You have a mole that is larger than a pencil eraser.  Always use sunscreen. Apply sunscreen liberally and repeat throughout the day.  Protect yourself by wearing long sleeves, pants, a wide-brimmed hat, and sunglasses when outside.  What should I know about heart disease, diabetes, and high blood pressure?  If you are 48-93 years of age, have your blood pressure checked every 3-5 years. If you are 20 years of age or older, have your blood  pressure checked every year. You should have your blood pressure measured twice-once when you are at a hospital or clinic, and once when you are not at a hospital or clinic. Record the average of the two measurements. To check your blood pressure when you are not at a hospital or clinic, you can use: ? An automated blood pressure machine at a pharmacy. ? A home blood pressure monitor.  Talk to your health care provider about your target blood pressure.  If you are between 22-77 years old, ask your health care provider if you should take aspirin to prevent heart disease.  Have regular diabetes screenings by checking your fasting blood sugar level. ? If you are at a normal weight and have a low risk for diabetes, have this test once every three years after the age of 12. ? If you are overweight and have a high risk for diabetes, consider being tested at a younger age or more often.  A one-time screening for abdominal aortic aneurysm (AAA) by ultrasound is recommended for men aged 43-75 years who are current or former smokers. What should I know about preventing infection? Hepatitis B If you have a higher risk for hepatitis B, you should be screened for this virus. Talk with your health care provider to find out if you are at risk for hepatitis B infection. Hepatitis C Blood testing is recommended for:  Everyone born from [birth year] through 1963.  Anyone with known risk factors for hepatitis C.  Sexually Transmitted Diseases (STDs)  You should be screened each year for STDs including gonorrhea and chlamydia if: ? You are sexually active and are younger than 55 years of age. ? You are older than 55 years of age and your health care provider tells you that you are at risk for this type of infection. ? Your sexual activity has changed since you were last screened and you are at an increased risk for chlamydia or gonorrhea. Ask your health care provider if you are at risk.  Talk with your health  care provider about whether you are at high risk of being infected with HIV. Your health care provider may recommend a prescription medicine to help prevent HIV infection.  What else can I do?  Schedule regular health, dental, and eye exams.  Stay current with your vaccines (immunizations).  Do not use any tobacco products, such as cigarettes, chewing tobacco, and e-cigarettes. If you need help quitting, ask your health care provider.  Limit alcohol intake to no more than 2 drinks per day. One drink equals 12 ounces of beer, 5 ounces of wine, or 1 ounces of hard liquor.  Do not use street drugs.  Do not share needles.  Ask your health care provider for help if you need support or information about quitting drugs.  Tell your health care provider if you often feel depressed.  Tell your health care provider if you have ever been abused or do not feel safe at home. This  information is not intended to replace advice given to you by your health care provider. Make sure you discuss any questions you have with your health care provider. Document Released: 02/13/2008 Document Revised: 04/15/2016 Document Reviewed: 05/21/2015 Elsevier Interactive Patient Education  [employer].

## 2018-02-04 NOTE — Assessment & Plan Note (Signed)
USPSTF grade A and B recommendations reviewed with patient; age-appropriate recommendations, preventive care, screening tests, etc discussed and encouraged; healthy living encouraged; see AVS for patient education given to patient  

## 2018-02-05 LAB — TSH: TSH: 2.19 m[IU]/L (ref 0.40–4.50)

## 2018-02-05 LAB — COMPLETE METABOLIC PANEL WITH GFR
AG Ratio: 1.8 (calc) (ref 1.0–2.5)
ALKALINE PHOSPHATASE (APISO): 70 U/L (ref 40–115)
ALT: 31 U/L (ref 9–46)
AST: 20 U/L (ref 10–35)
Albumin: 4.3 g/dL (ref 3.6–5.1)
BILIRUBIN TOTAL: 0.5 mg/dL (ref 0.2–1.2)
BUN: 19 mg/dL (ref 7–25)
CHLORIDE: 103 mmol/L (ref 98–110)
CO2: 30 mmol/L (ref 20–32)
Calcium: 9.4 mg/dL (ref 8.6–10.3)
Creat: 0.97 mg/dL (ref 0.70–1.33)
GFR, Est African American: 102 mL/min/{1.73_m2} (ref >=60)
GFR, Est Non African American: 88 mL/min/{1.73_m2} (ref >=60)
GLUCOSE: 78 mg/dL (ref 65–99)
Globulin: 2.4 g/dL (calc) (ref 1.9–3.7)
Potassium: 4.4 mmol/L (ref 3.5–5.3)
Sodium: 140 mmol/L (ref 135–146)
TOTAL PROTEIN: 6.7 g/dL (ref 6.1–8.1)

## 2018-02-05 LAB — CBC WITH DIFFERENTIAL/PLATELET
BASOS ABS: 21 {cells}/uL (ref 0–200)
Basophils Relative: 0.3 %
EOS PCT: 3.4 %
Eosinophils Absolute: 238 cells/uL (ref 15–500)
HEMATOCRIT: 48.3 % (ref 38.5–50.0)
Hemoglobin: 16.5 g/dL (ref 13.2–17.1)
Lymphs Abs: 1253 cells/uL (ref 850–3900)
MCH: 29.8 pg (ref 27.0–33.0)
MCHC: 34.2 g/dL (ref 32.0–36.0)
MCV: 87.2 fL (ref 80.0–100.0)
MPV: 9.7 fL (ref 7.5–12.5)
Monocytes Relative: 6.6 %
NEUTROS PCT: 71.8 %
Neutro Abs: 5026 cells/uL (ref 1500–7800)
PLATELETS: 257 10*3/uL (ref 140–400)
RBC: 5.54 10*6/uL (ref 4.20–5.80)
RDW: 12.7 % (ref 11.0–15.0)
TOTAL LYMPHOCYTE: 17.9 %
WBC mixed population: 462 cells/uL (ref 200–950)
WBC: 7 10*3/uL (ref 3.8–10.8)

## 2018-02-05 LAB — LIPID PANEL
Cholesterol: 189 mg/dL (ref <200)
HDL: 48 mg/dL (ref >40)
LDL Cholesterol (Calc): 125 mg/dL (calc) — ABNORMAL HIGH
Non-HDL Cholesterol (Calc): 141 mg/dL (calc) — ABNORMAL HIGH (ref <130)
TRIGLYCERIDES: 68 mg/dL (ref <150)
Total CHOL/HDL Ratio: 3.9 (calc) (ref <5.0)

## 2018-02-05 LAB — PSA: PSA: 0.9 ng/mL (ref <=4.0)

## 2018-02-08 LAB — URINALYSIS W MICROSCOPIC + REFLEX CULTURE
BACTERIA UA: NONE SEEN /HPF
Bilirubin Urine: NEGATIVE
Glucose, UA: NEGATIVE
HGB URINE DIPSTICK: NEGATIVE
HYALINE CAST: NONE SEEN /LPF
Ketones, ur: NEGATIVE
Leukocyte Esterase: NEGATIVE
Nitrites, Initial: NEGATIVE
PH: 6 (ref 5.0–8.0)
Protein, ur: NEGATIVE
Specific Gravity, Urine: 1.022 (ref 1.001–1.03)
Squamous Epithelial / LPF: NONE SEEN /HPF (ref <=5)
WBC UA: NONE SEEN /HPF (ref 0–5)

## 2018-02-08 LAB — NO CULTURE INDICATED

## 2018-03-04 ENCOUNTER — Other Ambulatory Visit: Payer: Self-pay | Admitting: Family Medicine

## 2018-03-04 NOTE — Telephone Encounter (Signed)
Copied from [place] [id number]. Topic: Quick Communication - See Telephone Encounter >> Mar 04, 2018  4:38 PM Percell Belt A wrote: CRM for notification. See Telephone encounter for: 03/04/18.  Pt called in and had some questions about concerning his prostrate.  He had some Additional questions about after he was seen for his cpe.  He also wanted to know if his  fluticasone (FLONASE) 50 MCG/ACT nasal spray [[id number]] could be called in?   Best number [place]

## 2018-03-07 MED ORDER — FLUTICASONE PROPIONATE 50 MCG/ACT NA SUSP: 2.0000 | Freq: Every day | NASAL | 3 refills | Status: DC

## 2018-03-07 NOTE — Telephone Encounter (Signed)
Left detailed voicemail

## 2018-03-07 NOTE — Telephone Encounter (Signed)
I left messages at work and home; will try him tomorrow

## 2018-03-08 NOTE — Telephone Encounter (Signed)
I called again, left message He does not have MyChart ---------------------------- Staff, please contact patient See if he was wanting to be referred to urology for prostate monitoring (my recommendation) OR try medicine and then repeat rectal/prostate exam in 6 months

## 2018-03-09 NOTE — Telephone Encounter (Signed)
Left another detailed voicemail 

## 2018-05-06 ENCOUNTER — Ambulatory Visit: Payer: Managed Care, Other (non HMO)

## 2019-02-10 ENCOUNTER — Encounter: Payer: Managed Care, Other (non HMO) | Admitting: Family Medicine

## 2019-02-17 ENCOUNTER — Encounter: Payer: Managed Care, Other (non HMO) | Admitting: Nurse Practitioner

## 2019-02-27 ENCOUNTER — Encounter: Payer: Self-pay | Admitting: Nurse Practitioner

## 2019-02-27 ENCOUNTER — Other Ambulatory Visit: Payer: Self-pay

## 2019-02-27 ENCOUNTER — Ambulatory Visit (INDEPENDENT_AMBULATORY_CARE_PROVIDER_SITE_OTHER): Payer: Managed Care, Other (non HMO) | Admitting: Nurse Practitioner

## 2019-02-27 VITALS — BP 118/76 | HR 92 | Temp 97.3°F | Resp 14 | Ht 65.5 in | Wt 141.2 lb

## 2019-02-27 DIAGNOSIS — Z125 Encounter for screening for malignant neoplasm of prostate: Secondary | ICD-10-CM

## 2019-02-27 DIAGNOSIS — Z0000 Encounter for general adult medical examination without abnormal findings: Secondary | ICD-10-CM

## 2019-02-27 DIAGNOSIS — L259 Unspecified contact dermatitis, unspecified cause: Secondary | ICD-10-CM

## 2019-02-27 DIAGNOSIS — Z131 Encounter for screening for diabetes mellitus: Secondary | ICD-10-CM

## 2019-02-27 DIAGNOSIS — Z1211 Encounter for screening for malignant neoplasm of colon: Secondary | ICD-10-CM | POA: Diagnosis not present

## 2019-02-27 DIAGNOSIS — E782 Mixed hyperlipidemia: Secondary | ICD-10-CM

## 2019-02-27 DIAGNOSIS — J302 Other seasonal allergic rhinitis: Secondary | ICD-10-CM

## 2019-02-27 MED ORDER — FLUTICASONE PROPIONATE 50 MCG/ACT NA SUSP: 2.0000 | Freq: Every day | NASAL | 3 refills | Status: DC

## 2019-02-27 MED ORDER — EUCRISA 2 % EX OINT: 1.0000 "application " | TOPICAL_OINTMENT | Freq: Two times a day (BID) | CUTANEOUS | 2 refills | Status: DC

## 2019-02-27 NOTE — Patient Instructions (Addendum)
-   Call insurance to see about shingrex coverage.  - Use thick lotion like eucerin cream or Cerave Eczema OR prescription eucrisa twice a day.  General recommendations: 150 minutes of physical activity weekly, eat two servings of fish weekly, eat one serving of tree nuts ( cashews, pistachios, pecans, almonds.Marland Kitchen) every other day, eat 6 servings of fruit/vegetables daily and drink plenty of water and avoid sweet beverages. Recommend at least 64 ounces of water daily.   Bad cholesterol, also called low-density lipoprotein (LDL), carries cholesterol and other fats that your liver makes to your body tissue. If it builds up in blood vessels, LDL can cause heart disease and other health problems. Your LDL level should be below 100. If you have diabetes or a possible heart problem, your LDL should be below 70.  Eat: Eat 20 to 30 grams of soluble fiber every day. Foods such as fruits and vegetables, whole grains, beans, peas, nuts, and seeds can help lower LDL. Avoid: Saturated fats (Dairy foods - such as butter, cream, ghee, regular-fat milk and cheese. Meat - such as fatty cuts of beef, pork and lamb, processed meats like salami, sausages and the skin on chicken. Lard., fatty snack foods, cakes, biscuits, pies and deep fried foods) Avoid smoking   Stay Safe in the [place] The majority of sun exposure occurs before age 16 and skin cancer can take 20 years or more to develop. Whether your sun bathing days are behind you or you still spend time pursuing the perfect tan, you should be concerned about skin cancer.  Remember, the sun's ultraviolet (UV) rays can reflect off water, sand, concrete and snow, and can reach below the water's surface. Certain types of UV light penetrate fog and clouds, so it's possible to get sunburn even on overcast days.  Avoid direct sunlight as much as possible during the peak sun hours, generally 10 a.m. to 3 p.m., or seek shade during this part of day. Wear broad-spectrum sunscreen -  with an SPF of at least 30 - containing both UVA and UVB protection. Look for ingredients like [employer] (also known as avobenzone) or titanium dioxide on the label. Reapply sunscreen frequently, at least every two hours when outdoors, especially if you perspire or you've been swimming. Your best bet is to choose water-resistant products that are more likely to stay on your skin. Wear lip balm with an SPF 15 or higher. Wear a hat and other protective clothing while in the sun. Tightly woven fibers and darker clothing generally provide more protection. Also, look for products approved by the American Academy of Dermatology. Wear UV-protective sunglasses.

## 2019-02-27 NOTE — Progress Notes (Signed)
Name: Brandon Howell   MRN: [id number]    DOB: 1961-03-01   Date:02/27/2019       Progress Note  Subjective  Chief Complaint  Chief Complaint  Patient presents with  . Annual Exam    HPI  Patient presents for annual CPE.  USPSTF grade A and B recommendations:  Diet:  3 meals a day Does not eat fast food, or pre-pared food often. Occasionally a sandwich Mostly eats chicken, some beef Oats, nuts and fruits in the morning Gets a serving of fruit/veggies with every meal Drinks water- 64 ounces of water.   Exercise:  Pretty active on the weekends not much on week days.  Depression: phq 9 is negative Depression screen [facility] 2/9 02/27/2019 02/04/2018 02/04/2018 09/07/2016  Decreased Interest 0 0 0 0  Down, Depressed, Hopeless 0 0 1 0  PHQ - 2 Score 0 0 1 0  Altered sleeping 0 0 - -  Tired, decreased energy 0 0 - -  Change in appetite 0 0 - -  Feeling bad or failure about yourself  0 0 - -  Trouble concentrating 0 0 - -  Moving slowly or fidgety/restless 0 0 - -  Suicidal thoughts 0 0 - -  PHQ-9 Score 0 0 - -  Difficult doing work/chores Not difficult at all Not difficult at all - -    Hypertension:  BP Readings from Last 3 Encounters:  02/27/19 118/76  02/04/18 108/72  12/06/16 (!) 125/91   Obesity: Wt Readings from Last 3 Encounters:  02/27/19 141 lb 3.2 oz (64 kg)  02/04/18 139 lb 11.2 oz (63.4 kg)  12/06/16 142 lb (64.4 kg)   BMI Readings from Last 3 Encounters:  02/27/19 23.14 kg/m  02/04/18 22.89 kg/m  12/06/16 22.24 kg/m     Lipids:  Lab Results  Component Value Date   CHOL 189 02/04/2018   CHOL 224 (H) 09/07/2016   CHOL 195 02/25/2015   Lab Results  Component Value Date   HDL 48 02/04/2018   HDL 55 09/07/2016   HDL 48 02/25/2015   Lab Results  Component Value Date   LDLCALC 125 (H) 02/04/2018   LDLCALC 149 (H) 09/07/2016   LDLCALC 129 (H) 02/25/2015   Lab Results  Component Value Date   TRIG 68 02/04/2018   TRIG 101 09/07/2016   TRIG 90  02/25/2015   Lab Results  Component Value Date   CHOLHDL 3.9 02/04/2018   CHOLHDL 4.1 09/07/2016   No results found for: LDLDIRECT Glucose:  Glucose, Bld  Date Value Ref Range Status  02/04/2018 78 65 - 99 mg/dL Final    Comment:    .            Fasting reference interval .   09/07/2016 80 65 - 99 mg/dL Final      Office Visit from 02/27/2019 in [facility]  AUDIT-C Score  0       Married STD testing and prevention (HIV/chl/gon/syphilis): denies  Hep C: completed  Skin cancer: discussed Colorectal cancer: due 03/2019 Prostate cancer: discussed Lab Results  Component Value Date   PSA 0.9 02/04/2018   PSA 0.8 09/07/2016    IPSS Questionnaire (AUA-7): Over the past month.   1)  How often have you had a sensation of not emptying your bladder completely after you finish urinating?  1 - Less than 1 time in 5  2)  How often have you had to urinate again less than two hours after  you finished urinating? 0 - Not at all  3)  How often have you found you stopped and started again several times when you urinated?  0 - Not at all  4) How difficult have you found it to postpone urination?  0 - Not at all  5) How often have you had a weak urinary stream?  0 - Not at all  6) How often have you had to push or strain to begin urination?  0 - Not at all  7) How many times did you most typically get up to urinate from the time you went to bed until the time you got up in the morning?  0 - None  Total score:  0-7 mildly symptomatic   8-19 moderately symptomatic   20-35 severely symptomatic   Lung cancer:   Low Dose CT Chest recommended if Age 54-80 years, 30 pack-year currently smoking OR have quit w/in 15years. Patient does not qualify.    Advanced Care Planning: A voluntary discussion about advance care planning including the explanation and discussion of advance directives.  Discussed health care proxy and Living will, and the patient was able to identify a  health care proxy as wife, Brandon Howell.  Patient does not have a living will at present time. If patient does have living will, I have requested they bring this to the clinic to be scanned in to their chart.  Patient Active Problem List   Diagnosis Date Noted  . Elevated serum glutamic pyruvic transaminase (SGPT) level 09/09/2016  . Hyperlipidemia 09/09/2016  . Elevated hematocrit 09/09/2016  . Preventative health care 09/07/2016  . Vitamin D deficiency 02/25/2015  . PSA elevation 02/25/2015    Past Surgical History:  Procedure Laterality Date  . EYE SURGERY Bilateral 08/31/1965    Family History  Adopted: Yes  Problem Relation Age of Onset  . Thyroid disease Mother   . Hyperlipidemia Mother   . Allergic rhinitis Brother   . Allergic rhinitis Brother     Social History   Socioeconomic History  . Marital status: Married    Spouse name: Brandon Howell  . Number of children: Not on file  . Years of education: 82  . Highest education level: Some college, no degree  Occupational History  . Not on file  Social Needs  . Financial resource strain: Not hard at all  . Food insecurity    Worry: Never true    Inability: Never true  . Transportation needs    Medical: No    Non-medical: No  Tobacco Use  . Smoking status: Never Smoker  . Smokeless tobacco: Former Network engineer and Sexual Activity  . Alcohol use: Yes    Comment: occasional  . Drug use: No  . Sexual activity: Yes  Lifestyle  . Physical activity    Days per week: 0 days    Minutes per session: 0 min  . Stress: Not at all  Relationships  . Social Herbalist on phone: More than three times a week    Gets together: Never    Attends religious service: Never    Active member of club or organization: No    Attends meetings of clubs or organizations: Never    Relationship status: Married  . Intimate partner violence    Fear of current or ex partner: No    Emotionally abused: No    Physically abused: No     Forced sexual activity: No  Other Topics Concern  .  Not on file  Social History Narrative  . Not on file     Current Outpatient Medications:  .  fluticasone (FLONASE) 50 MCG/ACT nasal spray, Place 2 sprays into both nostrils daily., Disp: 48 g, Rfl: 3  No Known Allergies   Review of Systems  Constitutional: Negative for chills, fever and malaise/fatigue.  HENT: Negative for congestion, sinus pain and sore throat.   Eyes: Negative for blurred vision.  Respiratory: Negative for cough and shortness of breath.   Cardiovascular: Negative for chest pain, palpitations and leg swelling.  Gastrointestinal: Negative for abdominal pain, blood in stool, constipation, diarrhea and nausea.  Genitourinary: Negative for dysuria and hematuria.  Musculoskeletal: Negative for falls and joint pain.  Skin: Negative for rash.  Neurological: Negative for dizziness, tingling and headaches.  Endo/Heme/Allergies: Negative for polydipsia.  Psychiatric/Behavioral: The patient is not nervous/anxious and does not have insomnia.       Objective  Vitals:   02/27/19 0907  BP: 118/76  Pulse: 92  Resp: 14  Temp: (!) 97.3 F (36.3 C)  TempSrc: Tympanic  Weight: 141 lb 3.2 oz (64 kg)  Height: 5' 5.5" (1.664 m)    Body mass index is 23.14 kg/m.  Physical Exam  Constitutional: Patient appears well-developed and well-nourished. No distress.  HENT: Head: Normocephalic and atraumatic. Ears: B TMs ok, no erythema but notes bilateral effusion; Eyes: Conjunctivae and EOM are normal. Pupils are equal, round, and reactive to light. No scleral icterus.  Neck: Normal range of motion. Neck supple. No JVD present. No thyromegaly present.  Cardiovascular: Normal rate, regular rhythm and normal heart sounds.  No murmur heard. No BLE edema. Pulmonary/Chest: Effort normal and breath sounds normal. No respiratory distress. Abdominal: Soft. Bowel sounds are normal, no distension. There is no tenderness. no  masses MALE GENITALIA: deferred Musculoskeletal: Normal range of motion, no joint effusions. No gross deformities Neurological: he is alert and oriented to person, place, and time. No cranial nerve deficit. Coordination, balance, strength, speech and gait are normal.  Skin: Skin is warm and dry. No rash noted. No erythema. bilateral hands and up wrist have areas of dry crusted skin- worse on right hand- patient notes since excessive washing hands at work through pandemic Psychiatric: Patient has a normal mood and affect. behavior is normal. Judgment and thought content normal.   No results found for this or any previous visit (from the past 2160 hour(s)).   PHQ2/9: Depression screen [facility] 2/9 02/27/2019 02/04/2018 02/04/2018 09/07/2016  Decreased Interest 0 0 0 0  Down, Depressed, Hopeless 0 0 1 0  PHQ - 2 Score 0 0 1 0  Altered sleeping 0 0 - -  Tired, decreased energy 0 0 - -  Change in appetite 0 0 - -  Feeling bad or failure about yourself  0 0 - -  Trouble concentrating 0 0 - -  Moving slowly or fidgety/restless 0 0 - -  Suicidal thoughts 0 0 - -  PHQ-9 Score 0 0 - -  Difficult doing work/chores Not difficult at all Not difficult at all - -     Fall Risk: Fall Risk  02/27/2019 02/04/2018 09/07/2016  Falls in the past year? 0 No No  Number falls in past yr: 0 - -  Injury with Fall? 0 - -     Functional Status Survey: Is the patient deaf or have difficulty hearing?: No Does the patient have difficulty seeing, even when wearing glasses/contacts?: No Does the patient have difficulty concentrating, remembering, or  making decisions?: No Does the patient have difficulty walking or climbing stairs?: No Does the patient have difficulty dressing or bathing?: No Does the patient have difficulty doing errands alone such as visiting a doctor's office or shopping?: No    Assessment & Plan  1. Preventative health care - Lipid Profile - COMPLETE METABOLIC PANEL WITH GFR - PSA  2. Colon  cancer screening - Ambulatory referral to Gastroenterology  3. Screening for diabetes mellitus - COMPLETE METABOLIC PANEL WITH GFR  4. Mixed hyperlipidemia - Lipid Profile  5. Screening PSA (prostate specific antigen) - PSA  6. Seasonal allergies - fluticasone (FLONASE) 50 MCG/ACT nasal spray; Place 2 sprays into both nostrils daily.  Dispense: 48 g; Refill: 3  7. Contact dermatitis and eczema - Crisaborole (EUCRISA) 2 % OINT; Apply 1 application topically 2 (two) times a day.  Dispense: 60 g; Refill: 2   -Prostate cancer screening and PSA options (with potential risks and benefits of testing vs not testing) were discussed along with recent recs/guidelines. -USPSTF grade A and B recommendations reviewed with patient; age-appropriate recommendations, preventive care, screening tests, etc discussed and encouraged; healthy living encouraged; see AVS for patient education given to patient -Discussed importance of 150 minutes of physical activity weekly, eat two servings of fish weekly, eat one serving of tree nuts ( cashews, pistachios, pecans, almonds.Marland Kitchen) every other day, eat 6 servings of fruit/vegetables daily and drink plenty of water and avoid sweet beverages.  -Reviewed Health Maintenance: discussed shingrex- will call

## 2019-02-28 LAB — PSA: PSA: 1.3 ng/mL (ref <=4.0)

## 2019-02-28 LAB — COMPLETE METABOLIC PANEL WITH GFR
AG Ratio: 1.7 (calc) (ref 1.0–2.5)
ALT: 29 U/L (ref 9–46)
AST: 25 U/L (ref 10–35)
Albumin: 4 g/dL (ref 3.6–5.1)
Alkaline phosphatase (APISO): 71 U/L (ref 35–144)
BUN: 13 mg/dL (ref 7–25)
CO2: 28 mmol/L (ref 20–32)
Calcium: 9.3 mg/dL (ref 8.6–10.3)
Chloride: 106 mmol/L (ref 98–110)
Creat: 0.86 mg/dL (ref 0.70–1.33)
GFR, Est African American: 112 mL/min/{1.73_m2} (ref >=60)
GFR, Est Non African American: 97 mL/min/{1.73_m2} (ref >=60)
Globulin: 2.3 g/dL (calc) (ref 1.9–3.7)
Glucose, Bld: 86 mg/dL (ref 65–99)
Potassium: 4 mmol/L (ref 3.5–5.3)
Sodium: 141 mmol/L (ref 135–146)
Total Bilirubin: 0.3 mg/dL (ref 0.2–1.2)
Total Protein: 6.3 g/dL (ref 6.1–8.1)

## 2019-02-28 LAB — LIPID PANEL
Cholesterol: 192 mg/dL (ref <200)
HDL: 44 mg/dL (ref >=40)
LDL Cholesterol (Calc): 125 mg/dL (calc) — ABNORMAL HIGH
Non-HDL Cholesterol (Calc): 148 mg/dL (calc) — ABNORMAL HIGH (ref <130)
Total CHOL/HDL Ratio: 4.4 (calc) (ref <5.0)
Triglycerides: 121 mg/dL (ref <150)

## 2019-04-12 ENCOUNTER — Encounter: Payer: Self-pay | Admitting: *Deleted

## 2020-01-23 ENCOUNTER — Other Ambulatory Visit: Payer: Self-pay

## 2020-01-23 ENCOUNTER — Telehealth (INDEPENDENT_AMBULATORY_CARE_PROVIDER_SITE_OTHER): Payer: Self-pay | Admitting: Gastroenterology

## 2020-01-23 DIAGNOSIS — Z1211 Encounter for screening for malignant neoplasm of colon: Secondary | ICD-10-CM

## 2020-01-23 NOTE — Progress Notes (Signed)
Gastroenterology Pre-Procedure Review  Request Date: 02/09/20 Requesting Physician: Dr. Allen Norris  PATIENT REVIEW QUESTIONS: The patient's wife responded to the following health history questions as indicated:    1. Are you having any GI issues? no 2. Do you have a personal history of Polyps? no 3. Do you have a family history of Colon Cancer or Polyps? no 4. Diabetes Mellitus? no 5. Joint replacements in the past 12 months?no 6. Major health problems in the past 3 months?no 7. Any artificial heart valves, MVP, or defibrillator?no    MEDICATIONS & ALLERGIES:    Patient reports the following regarding taking any anticoagulation/antiplatelet therapy:   Plavix, Coumadin, Eliquis, Xarelto, Lovenox, Pradaxa, Brilinta, or Effient? no Aspirin? no  Patient confirms/reports the following medications:  Current Outpatient Medications  Medication Sig Dispense Refill  . Crisaborole (EUCRISA) 2 % OINT Apply 1 application topically 2 (two) times a day. 60 g 2  . fluticasone (FLONASE) 50 MCG/ACT nasal spray Place 2 sprays into both nostrils daily. 48 g 3   No current facility-administered medications for this visit.    Patient confirms/reports the following allergies:  No Known Allergies  No orders of the defined types were placed in this encounter.   AUTHORIZATION INFORMATION Primary Insurance: 1D#: Group #:  Secondary Insurance: 1D#: Group #:  SCHEDULE INFORMATION: Date: 02/09/20 Time: Nurse will call the day before to give arrival time [place]

## 2020-02-01 ENCOUNTER — Encounter: Payer: Self-pay | Admitting: Gastroenterology

## 2020-02-01 ENCOUNTER — Other Ambulatory Visit: Payer: Self-pay

## 2020-02-07 ENCOUNTER — Other Ambulatory Visit
Admission: RE | Admit: 2020-02-07 | Discharge: 2020-02-07 | Disposition: A | Discharge status: Home / Self Care | Payer: 59 | Source: Ambulatory Visit | Attending: Gastroenterology | Admitting: Gastroenterology

## 2020-02-07 ENCOUNTER — Other Ambulatory Visit: Payer: Self-pay

## 2020-02-07 DIAGNOSIS — Z20822 Contact with and (suspected) exposure to covid-19: Secondary | ICD-10-CM | POA: Diagnosis not present

## 2020-02-07 DIAGNOSIS — Z01812 Encounter for preprocedural laboratory examination: Secondary | ICD-10-CM | POA: Diagnosis present

## 2020-02-07 LAB — SARS CORONAVIRUS 2 (TAT 6-24 HRS): SARS Coronavirus 2: NEGATIVE

## 2020-02-08 NOTE — Discharge Instructions (Signed)
General Anesthesia, Adult, Care After This sheet gives you information about how to care for yourself after your procedure. Your health care provider may also give you more specific instructions. If you have problems or questions, contact your health care provider. What can I expect after the procedure? After the procedure, the following side effects are common:  Pain or discomfort at the IV site.  Nausea.  Vomiting.  Sore throat.  Trouble concentrating.  Feeling cold or chills.  Weak or tired.  Sleepiness and fatigue.  Soreness and body aches. These side effects can affect parts of the body that were not involved in surgery. Follow these instructions at home:  For at least 24 hours after the procedure:  Have a responsible adult stay with you. It is important to have someone help care for you until you are awake and alert.  Rest as needed.  Do not: ? Participate in activities in which you could fall or become injured. ? Drive. ? Use heavy machinery. ? Drink alcohol. ? Take sleeping pills or medicines that cause drowsiness. ? Make important decisions or sign legal documents. ? Take care of children on your own. Eating and drinking  Follow any instructions from your health care provider about eating or drinking restrictions.  When you feel hungry, start by eating small amounts of foods that are soft and easy to digest (bland), such as toast. Gradually return to your regular diet.  Drink enough fluid to keep your urine pale yellow.  If you vomit, rehydrate by drinking water, juice, or clear broth. General instructions  If you have sleep apnea, surgery and certain medicines can increase your risk for breathing problems. Follow instructions from your health care provider about wearing your sleep device: ? Anytime you are sleeping, including during daytime naps. ? While taking prescription pain medicines, sleeping medicines, or medicines that make you drowsy.  Return to  your normal activities as told by your health care provider. Ask your health care provider what activities are safe for you.  Take over-the-counter and prescription medicines only as told by your health care provider.  If you smoke, do not smoke without supervision.  Keep all follow-up visits as told by your health care provider. This is important. Contact a health care provider if:  You have nausea or vomiting that does not get better with medicine.  You cannot eat or drink without vomiting.  You have pain that does not get better with medicine.  You are unable to pass urine.  You develop a skin rash.  You have a fever.  You have redness around your IV site that gets worse. Get help right away if:  You have difficulty breathing.  You have chest pain.  You have blood in your urine or stool, or you vomit blood. Summary  After the procedure, it is common to have a sore throat or nausea. It is also common to feel tired.  Have a responsible adult stay with you for the first 24 hours after general anesthesia. It is important to have someone help care for you until you are awake and alert.  When you feel hungry, start by eating small amounts of foods that are soft and easy to digest (bland), such as toast. Gradually return to your regular diet.  Drink enough fluid to keep your urine pale yellow.  Return to your normal activities as told by your health care provider. Ask your health care provider what activities are safe for you. This information is not   intended to replace advice given to you by your health care provider. Make sure you discuss any questions you have with your health care provider. Document Revised: 08/20/2017 Document Reviewed: 04/02/2017 Elsevier Patient Education  2020 Elsevier Inc.  

## 2020-02-09 ENCOUNTER — Encounter: Payer: Self-pay | Admitting: Gastroenterology

## 2020-02-09 ENCOUNTER — Encounter
Admission: RE | Disposition: A | Discharge status: Home / Self Care | Payer: Self-pay | Source: Home / Self Care | Attending: Gastroenterology

## 2020-02-09 ENCOUNTER — Ambulatory Visit
Admission: RE | Admit: 2020-02-09 | Discharge: 2020-02-09 | Disposition: A | Discharge status: Home / Self Care | Payer: 59 | Attending: Gastroenterology | Admitting: Gastroenterology

## 2020-02-09 ENCOUNTER — Ambulatory Visit: Payer: 59 | Admitting: Anesthesiology

## 2020-02-09 ENCOUNTER — Other Ambulatory Visit: Payer: Self-pay

## 2020-02-09 DIAGNOSIS — K640 First degree hemorrhoids: Secondary | ICD-10-CM | POA: Insufficient documentation

## 2020-02-09 DIAGNOSIS — Z860100 Personal history of colon polyps, unspecified: Secondary | ICD-10-CM

## 2020-02-09 DIAGNOSIS — E559 Vitamin D deficiency, unspecified: Secondary | ICD-10-CM | POA: Insufficient documentation

## 2020-02-09 DIAGNOSIS — K5730 Diverticulosis of large intestine without perforation or abscess without bleeding: Secondary | ICD-10-CM | POA: Insufficient documentation

## 2020-02-09 DIAGNOSIS — Z1211 Encounter for screening for malignant neoplasm of colon: Secondary | ICD-10-CM | POA: Insufficient documentation

## 2020-02-09 DIAGNOSIS — Z79899 Other long term (current) drug therapy: Secondary | ICD-10-CM | POA: Diagnosis not present

## 2020-02-09 DIAGNOSIS — D125 Benign neoplasm of sigmoid colon: Secondary | ICD-10-CM | POA: Diagnosis not present

## 2020-02-09 DIAGNOSIS — D123 Benign neoplasm of transverse colon: Secondary | ICD-10-CM | POA: Diagnosis not present

## 2020-02-09 DIAGNOSIS — K635 Polyp of colon: Secondary | ICD-10-CM

## 2020-02-09 HISTORY — PX: POLYPECTOMY: SHX5525

## 2020-02-09 HISTORY — DX: Unspecified asthma, uncomplicated: J45.909

## 2020-02-09 HISTORY — DX: Gastro-esophageal reflux disease without esophagitis: K21.9

## 2020-02-09 HISTORY — DX: Other seasonal allergic rhinitis: J30.2

## 2020-02-09 HISTORY — PX: COLONOSCOPY WITH PROPOFOL: SHX5780

## 2020-02-09 SURGERY — COLONOSCOPY WITH PROPOFOL
Anesthesia: General | Site: Rectum

## 2020-02-09 MED ORDER — ACETAMINOPHEN 325 MG PO TABS: 325.0000 mg | ORAL_TABLET | Freq: Once | ORAL | Status: DC

## 2020-02-09 MED ORDER — STERILE WATER FOR IRRIGATION IR SOLN
Status: DC | PRN
  Administered 2020-02-09: 50 mL

## 2020-02-09 MED ORDER — LIDOCAINE HCL (CARDIAC) PF 100 MG/5ML IV SOSY: PREFILLED_SYRINGE | INTRAVENOUS | Status: DC | PRN

## 2020-02-09 MED ORDER — PROPOFOL 10 MG/ML IV BOLUS
INTRAVENOUS | Status: DC | PRN
  Administered 2020-02-09 (×3): 40 mg via INTRAVENOUS
  Administered 2020-02-09: 30 mg via INTRAVENOUS
  Administered 2020-02-09: 140 mg via INTRAVENOUS
  Administered 2020-02-09: 40 mg via INTRAVENOUS

## 2020-02-09 MED ORDER — ACETAMINOPHEN 160 MG/5ML PO SOLN: 325.0000 mg | Freq: Once | ORAL | Status: DC

## 2020-02-09 MED ORDER — LACTATED RINGERS IV SOLN
10.0000 mL/h | INTRAVENOUS | Status: DC
  Administered 2020-02-09: 10 mL/h via INTRAVENOUS

## 2020-02-09 MED ORDER — LIDOCAINE HCL (CARDIAC) PF 100 MG/5ML IV SOSY
PREFILLED_SYRINGE | INTRAVENOUS | Status: DC | PRN
  Administered 2020-02-09: 30 mg via INTRAVENOUS

## 2020-02-09 SURGICAL SUPPLY — 24 items
CLIP HMST 235XBRD CATH ROT (MISCELLANEOUS) IMPLANT
CLIP RESOLUTION 360 11X235 (MISCELLANEOUS)
ELECT REM PT RETURN 9FT ADLT (ELECTROSURGICAL)
ELECTRODE REM PT RTRN 9FT ADLT (ELECTROSURGICAL) IMPLANT
FCP ESCP3.2XJMB 240X2.8X (MISCELLANEOUS)
FORCEPS BIOP RAD 4 LRG CAP 4 (CUTTING FORCEPS) ×3 IMPLANT
FORCEPS BIOP RJ4 240 W/NDL (MISCELLANEOUS)
FORCEPS ESCP3.2XJMB 240X2.8X (MISCELLANEOUS) IMPLANT
GOWN CVR UNV OPN BCK APRN NK (MISCELLANEOUS) ×2 IMPLANT
GOWN ISOL THUMB LOOP REG UNIV (MISCELLANEOUS) ×4
INJECTOR VARIJECT VIN23 (MISCELLANEOUS) IMPLANT
KIT DEFENDO VALVE AND CONN (KITS) IMPLANT
KIT ENDO PROCEDURE OLY (KITS) ×3 IMPLANT
MANIFOLD NEPTUNE II (INSTRUMENTS) IMPLANT
MARKER SPOT ENDO TATTOO 5ML (MISCELLANEOUS) IMPLANT
PROBE APC STR FIRE (PROBE) IMPLANT
RETRIEVER NET ROTH 2.5X230 LF (MISCELLANEOUS) IMPLANT
SNARE SHORT THROW 13M SML OVAL (MISCELLANEOUS) IMPLANT
SNARE SHORT THROW 30M LRG OVAL (MISCELLANEOUS) IMPLANT
SNARE SNG USE RND 15MM (INSTRUMENTS) IMPLANT
SPOT EX ENDOSCOPIC TATTOO (MISCELLANEOUS)
TRAP ETRAP POLY (MISCELLANEOUS) IMPLANT
VARIJECT INJECTOR VIN23 (MISCELLANEOUS)
WATER STERILE IRR 250ML POUR (IV SOLUTION) ×3 IMPLANT

## 2020-02-09 NOTE — H&P (Signed)
Lucilla Lame, MD [facility] [address] [place], [place] [address] [phone] Fax : [phone]  Primary Care Physician:  [facility] [place] Primary Gastroenterologist:  Dr. Allen Norris  Pre-Procedure History & Physical: HPI:  Cameren Earnest is a 59 y.o. male is here for an colonoscopy.   Past Medical History:  Diagnosis Date  . Allergy   . Asthma    extreme exertion  . GERD (gastroesophageal reflux disease)    occasional refux  . PSA elevation 02/25/2015  . Seasonal allergies   . Vitamin D deficiency 02/25/2015    Past Surgical History:  Procedure Laterality Date  . EYE SURGERY Bilateral 08/31/1965    Prior to Admission medications   Medication Sig Start Date End Date Taking? Authorizing Provider  Cholecalciferol (VITAMIN D3) 50 MCG (2000 UT) TABS Take by mouth.   Yes [provider]  Multiple Vitamin (MULTIVITAMIN) capsule Take 1 capsule by mouth daily. Vitafusion   Yes [provider]  Zinc Gluconate 50 MG CAPS Take by mouth.   Yes [provider]    Allergies as of 01/23/2020  . (No Known Allergies)    Family History  Adopted: Yes  Problem Relation Age of Onset  . Thyroid disease Mother   . Hyperlipidemia Mother   . Allergic rhinitis Brother   . Allergic rhinitis Brother     Social History   Socioeconomic History  . Marital status: Married    Spouse name: Marliss Czar  . Number of children: Not on file  . Years of education: 39  . Highest education level: Some college, no degree  Occupational History  . Not on file  Tobacco Use  . Smoking status: Never Smoker  . Smokeless tobacco: Former Network engineer  . Vaping Use: Never used  Substance and Sexual Activity  . Alcohol use: Yes    Comment: occasional  . Drug use: No  . Sexual activity: Yes  Other Topics Concern  . Not on file  Social History Narrative  . Not on file   Social Determinants of Health   Financial Resource Strain: Low Risk   .  Difficulty of Paying Living Expenses: Not hard at all  Food Insecurity: No Food Insecurity  . Worried About Charity fundraiser in the Last Year: Never true  . Ran Out of Food in the Last Year: Never true  Transportation Needs: No Transportation Needs  . Lack of Transportation (Medical): No  . Lack of Transportation (Non-Medical): No  Physical Activity: Inactive  . Days of Exercise per Week: 0 days  . Minutes of Exercise per Session: 0 min  Stress: No Stress Concern Present  . Feeling of Stress : Not at all  Social Connections: Moderately Isolated  . Frequency of Communication with Friends and Family: More than three times a week  . Frequency of Social Gatherings with Friends and Family: Never  . Attends Religious Services: Never  . Active Member of Clubs or Organizations: No  . Attends Archivist Meetings: Never  . Marital Status: Married  Human resources officer Violence: Not At Risk  . Fear of Current or Ex-Partner: No  . Emotionally Abused: No  . Physically Abused: No  . Sexually Abused: No    Review of Systems: See HPI, otherwise negative ROS  Physical Exam: BP 123/83   Pulse 71   Temp 97.7 F (36.5 C) (Temporal)   Resp 16   Ht 5\' 7"  (1.702 m)   Wt 63 kg  SpO2 96%   BMI 21.77 kg/m  General:   Alert,  pleasant and cooperative in NAD Head:  Normocephalic and atraumatic. Neck:  Supple; no masses or thyromegaly. Lungs:  Clear throughout to auscultation.    Heart:  Regular rate and rhythm. Abdomen:  Soft, nontender and nondistended. Normal bowel sounds, without guarding, and without rebound.   Neurologic:  Alert and  oriented x4;  grossly normal neurologically.  Impression/Plan: Cynthia Stainback is here for an colonoscopy to be performed for history of adenomatous colon polyps with last colonoscopy 02/2014  Risks, benefits, limitations, and alternatives regarding  colonoscopy have been reviewed with the patient.  Questions have been answered.  All parties  agreeable.   Lucilla Lame, MD  02/09/2020, 10:56 AM

## 2020-02-09 NOTE — Transfer of Care (Signed)
Immediate Anesthesia Transfer of Care Note  Patient: Brandon Howell  Procedure(s) Performed: COLONOSCOPY WITH PROPOFOL (N/A Rectum) POLYPECTOMY (N/A Rectum)  Patient Location: PACU  Anesthesia Type: General  Level of Consciousness: awake, alert  and patient cooperative  Airway and Oxygen Therapy: Patient Spontanous Breathing and Patient connected to supplemental oxygen  Post-op Assessment: Post-op Vital signs reviewed, Patient's Cardiovascular Status Stable, Respiratory Function Stable, Patent Airway and No signs of Nausea or vomiting  Post-op Vital Signs: Reviewed and stable  Complications: No complications documented.

## 2020-02-09 NOTE — Anesthesia Procedure Notes (Signed)
Performed by: Casia Corti, CRNA Pre-anesthesia Checklist: Patient identified, Emergency Drugs available, Suction available, Timeout performed and Patient being monitored Patient Re-evaluated:Patient Re-evaluated prior to induction Oxygen Delivery Method: Nasal cannula Placement Confirmation: positive ETCO2       

## 2020-02-09 NOTE — Anesthesia Postprocedure Evaluation (Signed)
Anesthesia Post Note  Patient: Brandon Howell  Procedure(s) Performed: COLONOSCOPY WITH PROPOFOL (N/A Rectum) POLYPECTOMY (N/A Rectum)     Patient location during evaluation: PACU Anesthesia Type: General Level of consciousness: awake and alert and oriented Pain management: satisfactory to patient Vital Signs Assessment: post-procedure vital signs reviewed and stable Respiratory status: spontaneous breathing, nonlabored ventilation and respiratory function stable Cardiovascular status: blood pressure returned to baseline and stable Postop Assessment: Adequate PO intake and No signs of nausea or vomiting Anesthetic complications: no   No complications documented.  Raliegh Ip

## 2020-02-09 NOTE — Anesthesia Preprocedure Evaluation (Signed)
Anesthesia Evaluation  Patient identified by MRN, date of birth, ID band Patient awake    Reviewed: Allergy & Precautions, H&P , NPO status , Patient's Chart, lab work & pertinent test results  Airway Mallampati: II  TM Distance: >3 FB Neck ROM: full    Dental no notable dental hx.    Pulmonary    Pulmonary exam normal breath sounds clear to auscultation       Cardiovascular Normal cardiovascular exam Rhythm:regular Rate:Normal     Neuro/Psych    GI/Hepatic GERD  ,  Endo/Other    Renal/GU      Musculoskeletal   Abdominal   Peds  Hematology   Anesthesia Other Findings   Reproductive/Obstetrics                             Anesthesia Physical Anesthesia Plan  ASA: II  Anesthesia Plan: General   Post-op Pain Management:    Induction: Intravenous  PONV Risk Score and Plan: 2 and Treatment may vary due to age or medical condition, TIVA and Propofol infusion  Airway Management Planned: Natural Airway  Additional Equipment:   Intra-op Plan:   Post-operative Plan:   Informed Consent: I have reviewed the patients History and Physical, chart, labs and discussed the procedure including the risks, benefits and alternatives for the proposed anesthesia with the patient or authorized representative who has indicated his/her understanding and acceptance.     Dental Advisory Given  Plan Discussed with: CRNA  Anesthesia Plan Comments:         Anesthesia Quick Evaluation

## 2020-02-09 NOTE — Op Note (Signed)
[facility] Patient Name: Brandon Howell Procedure Date: 02/09/2020 1:21 PM MRN: [id number] Account #: [id number] Date of Birth: 17-Nov-1960 Admit Type: Outpatient Age: 59 Room: [facility] OR ROOM 01 Gender: Male Note Status: Finalized Procedure:             Colonoscopy Indications:           High risk colon cancer surveillance: Personal history                         of colonic polyps Providers:             Lucilla Lame MD, MD Referring MD:          Arnetha Courser (Referring MD) Medicines:             Propofol per Anesthesia Complications:         No immediate complications. Procedure:             Pre-Anesthesia Assessment:                        - Prior to the procedure, a History and Physical was                         performed, and patient medications and allergies were                         reviewed. The patient's tolerance of previous                         anesthesia was also reviewed. The risks and benefits                         of the procedure and the sedation options and risks                         were discussed with the patient. All questions were                         answered, and informed consent was obtained. Prior                         Anticoagulants: The patient has taken no previous                         anticoagulant or antiplatelet agents. ASA Grade                         Assessment: II - A patient with mild systemic disease.                         After reviewing the risks and benefits, the patient                         was deemed in satisfactory condition to undergo the                         procedure.  After obtaining informed consent, the colonoscope was                         passed under direct vision. Throughout the procedure,                         the patient's blood pressure, pulse, and oxygen                         saturations were monitored continuously. The was                          introduced through the anus and advanced to the the                         cecum, identified by appendiceal orifice and ileocecal                         valve. The colonoscopy was performed without                         difficulty. The patient tolerated the procedure well.                         The quality of the bowel preparation was excellent. Findings:      The perianal and digital rectal examinations were normal.      Three sessile polyps were found in the transverse colon. The polyps were       2 to 3 mm in size. These polyps were removed with a cold biopsy forceps.       Resection and retrieval were complete.      Non-bleeding internal hemorrhoids were found during retroflexion. The       hemorrhoids were Grade I (internal hemorrhoids that do not prolapse).      A few small-mouthed diverticula were found in the sigmoid colon. Impression:            - Three 2 to 3 mm polyps in the transverse colon,                         removed with a cold biopsy forceps. Resected and                         retrieved.                        - Non-bleeding internal hemorrhoids.                        - Diverticulosis in the sigmoid colon. Recommendation:        - Discharge patient to home.                        - Resume previous diet.                        - Continue present medications.                        - Await pathology results.                        -  Repeat colonoscopy in 5 years for surveillance. Procedure Code(s):     --- Professional ---                        [phone], Colonoscopy, flexible; with biopsy, single or                         multiple Diagnosis Code(s):     --- Professional ---                        Z86.010, Personal history of colonic polyps                        K63.5, Polyp of colon CPT copyright 2019 American Medical Association. All rights reserved. The codes documented in this report are preliminary and upon coder review may  be revised to meet  current compliance requirements. Lucilla Lame MD, MD 02/09/2020 1:46:29 PM This report has been signed electronically. Number of Addenda: 0 Note Initiated On: 02/09/2020 1:21 PM Scope Withdrawal Time: 0 hours 8 minutes 42 seconds  Total Procedure Duration: 0 hours 11 minutes 44 seconds  Estimated Blood Loss:  Estimated blood loss: none. Estimated blood loss: none.      [facility]

## 2020-02-12 ENCOUNTER — Encounter: Payer: Self-pay | Admitting: Gastroenterology

## 2020-02-14 ENCOUNTER — Encounter: Payer: Self-pay | Admitting: Gastroenterology

## 2020-02-14 LAB — SURGICAL PATHOLOGY

## 2020-03-01 ENCOUNTER — Encounter: Payer: Managed Care, Other (non HMO) | Admitting: Family Medicine

## 2021-01-17 ENCOUNTER — Encounter: Payer: Managed Care, Other (non HMO) | Admitting: Family Medicine

## 2021-01-28 ENCOUNTER — Ambulatory Visit (INDEPENDENT_AMBULATORY_CARE_PROVIDER_SITE_OTHER): Payer: Managed Care, Other (non HMO) | Admitting: Unknown Physician Specialty

## 2021-01-28 ENCOUNTER — Other Ambulatory Visit: Payer: Self-pay

## 2021-01-28 ENCOUNTER — Encounter: Payer: Self-pay | Admitting: Unknown Physician Specialty

## 2021-01-28 VITALS — BP 118/76 | HR 82 | Temp 98.1°F | Resp 16 | Ht 67.0 in | Wt 140.4 lb

## 2021-01-28 DIAGNOSIS — Z0000 Encounter for general adult medical examination without abnormal findings: Secondary | ICD-10-CM | POA: Diagnosis not present

## 2021-01-28 DIAGNOSIS — Z23 Encounter for immunization: Secondary | ICD-10-CM

## 2021-01-28 NOTE — Progress Notes (Signed)
BP 118/76   Pulse 82   Temp 98.1 F (36.7 C) (Oral)   Resp 16   Ht 5\' 7"  (1.702 m)   Wt 140 lb 6.4 oz (63.7 kg)   SpO2 97%   BMI 21.99 kg/m    Subjective:    Patient ID: Brandon Howell, male    DOB: March 21, 1961, 60 y.o.   MRN: [id number]  HPI: Brandon Howell is a 60 y.o. male   The 10-year ASCVD risk score Brandon Howell DC Brandon Howell., et al., 2013) is: 6.2%   Values used to calculate the score:     Age: 34 years     Sex: Male     Is Non-Hispanic African American: No     Diabetic: No     Tobacco smoker: No     Systolic Blood Pressure: 094 mmHg     Is BP treated: No     HDL Cholesterol: 44 mg/dL     Total Cholesterol: 192 mg/dL   Depression screen [facility] 2/9 01/28/2021 02/27/2019 02/04/2018 02/04/2018 09/07/2016  Decreased Interest 0 0 0 0 0  Down, Depressed, Hopeless 0 0 0 1 0  PHQ - 2 Score 0 0 0 1 0  Altered sleeping 0 0 0 - -  Tired, decreased energy 0 0 0 - -  Change in appetite 0 0 0 - -  Feeling bad or failure about yourself  0 0 0 - -  Trouble concentrating 0 0 0 - -  Moving slowly or fidgety/restless 0 0 0 - -  Suicidal thoughts 0 0 0 - -  PHQ-9 Score 0 0 0 - -  Difficult doing work/chores Not difficult at all Not difficult at all Not difficult at all - -    Chief Complaint  Patient presents with  . Annual Exam   Social History   Socioeconomic History  . Marital status: Married    Spouse name: Brandon Howell  . Number of children: Not on file  . Years of education: 33  . Highest education level: Some college, no degree  Occupational History  . Not on file  Tobacco Use  . Smoking status: Never Smoker  . Smokeless tobacco: Former Network engineer  . Vaping Use: Never used  Substance and Sexual Activity  . Alcohol use: Yes    Comment: occasional  . Drug use: No  . Sexual activity: Yes  Other Topics Concern  . Not on file  Social History Narrative  . Not on file   Social Determinants of Health   Financial Resource Strain: Low Risk   . Difficulty of Paying Living  Expenses: Not hard at all  Food Insecurity: No Food Insecurity  . Worried About Charity fundraiser in the Last Year: Never true  . Ran Out of Food in the Last Year: Never true  Transportation Needs: No Transportation Needs  . Lack of Transportation (Medical): No  . Lack of Transportation (Non-Medical): No  Physical Activity: Insufficiently Active  . Days of Exercise per Week: 3 days  . Minutes of Exercise per Session: 40 min  Stress: No Stress Concern Present  . Feeling of Stress : Only a little  Social Connections: Moderately Isolated  . Frequency of Communication with Friends and Family: More than three times a week  . Frequency of Social Gatherings with Friends and Family: Once a week  . Attends Religious Services: Never  . Active Member of Clubs or Organizations: No  . Attends Archivist Meetings: Never  .  Marital Status: Married  Human resources officer Violence: Not At Risk  . Fear of Current or Ex-Partner: No  . Emotionally Abused: No  . Physically Abused: No  . Sexually Abused: No    Family History  Adopted: Yes  Problem Relation Age of Onset  . Thyroid disease Mother   . Hyperlipidemia Mother   . Allergic rhinitis Brother   . Allergic rhinitis Brother     Past Medical History:  Diagnosis Date  . Allergy   . Asthma    extreme exertion  . GERD (gastroesophageal reflux disease)    occasional refux  . PSA elevation 02/25/2015  . Seasonal allergies   . Vitamin D deficiency 02/25/2015   Past Surgical History:  Procedure Laterality Date  . COLONOSCOPY WITH PROPOFOL N/A 02/09/2020   Procedure: COLONOSCOPY WITH PROPOFOL;  Surgeon: Lucilla Lame, MD;  Location: [place];  Service: Endoscopy;  Laterality: N/A;  priority 4  . EYE SURGERY Bilateral 08/31/1965  . POLYPECTOMY N/A 02/09/2020   Procedure: POLYPECTOMY;  Surgeon: Lucilla Lame, MD;  Location: [place];  Service: Endoscopy;  Laterality: N/A;    Relevant past medical, surgical, family  and social history reviewed and updated as indicated. Interim medical history since our last visit reviewed. Allergies and medications reviewed and updated.  Review of Systems  Genitourinary:       Weaker urinary stream  All other systems reviewed and are negative.   Per HPI unless specifically indicated above     Objective:    BP 118/76   Pulse 82   Temp 98.1 F (36.7 C) (Oral)   Resp 16   Ht 5\' 7"  (1.702 m)   Wt 140 lb 6.4 oz (63.7 kg)   SpO2 97%   BMI 21.99 kg/m   Wt Readings from Last 3 Encounters:  01/28/21 140 lb 6.4 oz (63.7 kg)  02/09/20 139 lb (63 kg)  02/27/19 141 lb 3.2 oz (64 kg)    Physical Exam Constitutional:      Appearance: He is well-developed.  HENT:     Head: Normocephalic.     Right Ear: Tympanic membrane, ear canal and external ear normal.     Left Ear: Tympanic membrane, ear canal and external ear normal.     Mouth/Throat:     Pharynx: Uvula midline.  Eyes:     Pupils: Pupils are equal, round, and reactive to light.  Cardiovascular:     Rate and Rhythm: Normal rate and regular rhythm.     Heart sounds: Normal heart sounds. No murmur heard. No friction rub. No gallop.   Pulmonary:     Effort: Pulmonary effort is normal. No respiratory distress.     Breath sounds: Normal breath sounds.  Abdominal:     General: Bowel sounds are normal. There is no distension.     Palpations: Abdomen is soft.     Tenderness: There is no abdominal tenderness.  Genitourinary:    Prostate: Normal.     Rectum: Normal.  Musculoskeletal:        General: Normal range of motion.  Skin:    General: Skin is warm and dry.  Neurological:     Mental Status: He is alert and oriented to person, place, and time.     Deep Tendon Reflexes: Reflexes are normal and symmetric.  Psychiatric:        Behavior: Behavior normal.        Thought Content: Thought content normal.        Judgment: Judgment  normal.        Assessment & Plan:   Problem List Items Addressed This  Visit   None   Visit Diagnoses    Need for zoster vaccine    -  Primary   Relevant Orders   Varicella-zoster vaccine IM (Shingrix) (Completed)   Routine general medical examination at a health care facility       Check labs. Zostavax.  Colonoscopy, Td, Flu vaccines UTD.  Remains hesistant for Covid vaccines   Relevant Orders   Lipid panel   Comprehensive metabolic panel   CBC with Differential/Platelet   TSH   PSA       Follow up plan: No follow-ups on file.

## 2021-01-28 NOTE — Patient Instructions (Addendum)

## 2021-01-29 LAB — COMPREHENSIVE METABOLIC PANEL
AG Ratio: 2 (calc) (ref 1.0–2.5)
ALT: 30 U/L (ref 9–46)
AST: 25 U/L (ref 10–35)
Albumin: 4.3 g/dL (ref 3.6–5.1)
Alkaline phosphatase (APISO): 64 U/L (ref 35–144)
BUN: 17 mg/dL (ref 7–25)
CO2: 31 mmol/L (ref 20–32)
Calcium: 9.5 mg/dL (ref 8.6–10.3)
Chloride: 103 mmol/L (ref 98–110)
Creat: 0.85 mg/dL (ref 0.70–1.33)
Globulin: 2.2 g/dL (calc) (ref 1.9–3.7)
Glucose, Bld: 71 mg/dL (ref 65–99)
Potassium: 4.4 mmol/L (ref 3.5–5.3)
Sodium: 141 mmol/L (ref 135–146)
Total Bilirubin: 0.5 mg/dL (ref 0.2–1.2)
Total Protein: 6.5 g/dL (ref 6.1–8.1)

## 2021-01-29 LAB — LIPID PANEL
Cholesterol: 176 mg/dL (ref <200)
HDL: 47 mg/dL (ref >=40)
LDL Cholesterol (Calc): 110 mg/dL (calc) — ABNORMAL HIGH
Non-HDL Cholesterol (Calc): 129 mg/dL (calc) (ref <130)
Total CHOL/HDL Ratio: 3.7 (calc) (ref <5.0)
Triglycerides: 96 mg/dL (ref <150)

## 2021-01-29 LAB — CBC WITH DIFFERENTIAL/PLATELET
Absolute Monocytes: 400 cells/uL (ref 200–950)
Basophils Absolute: 42 cells/uL (ref 0–200)
Basophils Relative: 0.9 %
Eosinophils Absolute: 221 cells/uL (ref 15–500)
Eosinophils Relative: 4.7 %
HCT: 48.1 % (ref 38.5–50.0)
Hemoglobin: 15.8 g/dL (ref 13.2–17.1)
Lymphs Abs: 1401 cells/uL (ref 850–3900)
MCH: 29.6 pg (ref 27.0–33.0)
MCHC: 32.8 g/dL (ref 32.0–36.0)
MCV: 90.1 fL (ref 80.0–100.0)
MPV: 9.8 fL (ref 7.5–12.5)
Monocytes Relative: 8.5 %
Neutro Abs: 2637 cells/uL (ref 1500–7800)
Neutrophils Relative %: 56.1 %
Platelets: 270 10*3/uL (ref 140–400)
RBC: 5.34 10*6/uL (ref 4.20–5.80)
RDW: 12.7 % (ref 11.0–15.0)
Total Lymphocyte: 29.8 %
WBC: 4.7 10*3/uL (ref 3.8–10.8)

## 2021-01-29 LAB — PSA: PSA: 0.56 ng/mL (ref <=4.00)

## 2021-01-29 LAB — TSH: TSH: 4.53 mIU/L — ABNORMAL HIGH (ref 0.40–4.50)

## 2022-03-16 ENCOUNTER — Encounter: Payer: Self-pay | Admitting: Internal Medicine

## 2022-03-16 ENCOUNTER — Ambulatory Visit (INDEPENDENT_AMBULATORY_CARE_PROVIDER_SITE_OTHER): Payer: Self-pay | Admitting: Internal Medicine

## 2022-03-16 VITALS — BP 132/84 | HR 89 | Temp 98.3°F | Resp 16 | Ht 67.0 in | Wt 139.2 lb

## 2022-03-16 DIAGNOSIS — Z13220 Encounter for screening for lipoid disorders: Secondary | ICD-10-CM | POA: Diagnosis not present

## 2022-03-16 DIAGNOSIS — Z125 Encounter for screening for malignant neoplasm of prostate: Secondary | ICD-10-CM

## 2022-03-16 DIAGNOSIS — R7989 Other specified abnormal findings of blood chemistry: Secondary | ICD-10-CM

## 2022-03-16 DIAGNOSIS — Z0000 Encounter for general adult medical examination without abnormal findings: Secondary | ICD-10-CM | POA: Diagnosis not present

## 2022-03-16 NOTE — Patient Instructions (Signed)
It was great seeing you today!  Plan discussed at today's visit: -Blood work ordered today, results will be uploaded to [facility].   Preventive Care 12-59 Years Old, Male Preventive care refers to lifestyle choices and visits with your health care provider that can promote health and wellness. Preventive care visits are also called wellness exams. What can I expect for my preventive care visit? Counseling During your preventive care visit, your health care provider may ask about your: Medical history, including: Past medical problems. Family medical history. Current health, including: Emotional well-being. Home life and relationship well-being. Sexual activity. Lifestyle, including: Alcohol, nicotine or tobacco, and drug use. Access to firearms. Diet, exercise, and sleep habits. Safety issues such as seatbelt and bike helmet use. Sunscreen use. Work and work Statistician. Physical exam Your health care provider will check your: Height and weight. These may be used to calculate your BMI (body mass index). BMI is a measurement that tells if you are at a healthy weight. Waist circumference. This measures the distance around your waistline. This measurement also tells if you are at a healthy weight and may help predict your risk of certain diseases, such as type 2 diabetes and high blood pressure. Heart rate and blood pressure. Body temperature. Skin for abnormal spots. What immunizations do I need?  Vaccines are usually given at various ages, according to a schedule. Your health care provider will recommend vaccines for you based on your age, medical history, and lifestyle or other factors, such as travel or where you work. What tests do I need? Screening Your health care provider may recommend screening tests for certain conditions. This may include: Lipid and cholesterol levels. Diabetes screening. This is done by checking your blood sugar (glucose) after you have not eaten for a  while (fasting). Hepatitis B test. Hepatitis C test. HIV (human immunodeficiency virus) test. STI (sexually transmitted infection) testing, if you are at risk. Lung cancer screening. Prostate cancer screening. Colorectal cancer screening. Talk with your health care provider about your test results, treatment options, and if necessary, the need for more tests. Follow these instructions at home: Eating and drinking  Eat a diet that includes fresh fruits and vegetables, whole grains, lean protein, and low-fat dairy products. Take vitamin and mineral supplements as recommended by your health care provider. Do not drink alcohol if your health care provider tells you not to drink. If you drink alcohol: Limit how much you have to 0-2 drinks a day. Know how much alcohol is in your drink. In the [place] one drink equals one 12 oz bottle of beer (355 mL), one 5 oz glass of wine (148 mL), or one 1 oz glass of hard liquor (44 mL). Lifestyle Brush your teeth every morning and night with fluoride toothpaste. Floss one time each day. Exercise for at least 30 minutes 5 or more days each week. Do not use any products that contain nicotine or tobacco. These products include cigarettes, chewing tobacco, and vaping devices, such as e-cigarettes. If you need help quitting, ask your health care provider. Do not use drugs. If you are sexually active, practice safe sex. Use a condom or other form of protection to prevent STIs. Take aspirin only as told by your health care provider. Make sure that you understand how much to take and what form to take. Work with your health care provider to find out whether it is safe and beneficial for you to take aspirin daily. Find healthy ways to manage stress, such as: Meditation,  yoga, or listening to music. Journaling. Talking to a trusted person. Spending time with friends and family. Minimize exposure to UV radiation to reduce your risk of skin cancer. Safety Always  wear your seat belt while driving or riding in a vehicle. Do not drive: If you have been drinking alcohol. Do not ride with someone who has been drinking. When you are tired or distracted. While texting. If you have been using any mind-altering substances or drugs. Wear a helmet and other protective equipment during sports activities. If you have firearms in your house, make sure you follow all gun safety procedures. What's next? Go to your health care provider once a year for an annual wellness visit. Ask your health care provider how often you should have your eyes and teeth checked. Stay up to date on all vaccines. This information is not intended to replace advice given to you by your health care provider. Make sure you discuss any questions you have with your health care provider. Document Revised: 02/12/2021 Document Reviewed: 02/12/2021 Elsevier Patient Education  [place].  Follow up in: 1 year  Take care and let us know if you have any questions or concerns prior to your next visit.  Dr. Rosana Berger

## 2022-03-16 NOTE — Progress Notes (Signed)
Name: Brandon Howell   MRN: [id number]    DOB: [birth year]   Date:03/16/2022       Progress Note  Subjective  Chief Complaint  Chief Complaint  Patient presents with   Annual Exam    HPI  Patient presents for annual CPE.  IPSS Questionnaire (AUA-7): Over the past month.   1)  How often have you had a sensation of not emptying your bladder completely after you finish urinating?  1 - Less than 1 time in 5  2)  How often have you had to urinate again less than two hours after you finished urinating? 1 - Less than 1 time in 5  3)  How often have you found you stopped and started again several times when you urinated?  0 - Not at all  4) How difficult have you found it to postpone urination?  0 - Not at all  5) How often have you had a weak urinary stream?  1 - Less than 1 time in 5  6) How often have you had to push or strain to begin urination?  0 - Not at all  7) How many times did you most typically get up to urinate from the time you went to bed until the time you got up in the morning?  0 - None  Total score:  0-7 mildly symptomatic   8-19 moderately symptomatic   20-35 severely symptomatic     Diet: well rounded - skips lunch, eats a lot of vegetables  Exercise: no regular exercise - walks some at work   Depression: phq 9 is negative    03/16/2022    8:33 AM 01/28/2021    8:03 AM 02/27/2019    9:13 AM 02/04/2018    1:56 PM 02/04/2018    1:55 PM  Depression screen PHQ 2/9  Decreased Interest 0 0 0 0 0  Down, Depressed, Hopeless 0 0 0 0 1  PHQ - 2 Score 0 0 0 0 1  Altered sleeping 0 0 0 0   Tired, decreased energy 0 0 0 0   Change in appetite 0 0 0 0   Feeling bad or failure about yourself  0 0 0 0   Trouble concentrating 0 0 0 0   Moving slowly or fidgety/restless 0 0 0 0   Suicidal thoughts 0 0 0 0   PHQ-9 Score 0 0 0 0   Difficult doing work/chores Not difficult at all Not difficult at all Not difficult at all Not difficult at all     Hypertension:  BP Readings  from Last 3 Encounters:  03/16/22 132/84  01/28/21 118/76  02/09/20 136/85    Obesity: Wt Readings from Last 3 Encounters:  03/16/22 139 lb 3.2 oz (63.1 kg)  01/28/21 140 lb 6.4 oz (63.7 kg)  02/09/20 139 lb (63 kg)   BMI Readings from Last 3 Encounters:  03/16/22 21.80 kg/m  01/28/21 21.99 kg/m  02/09/20 21.77 kg/m     Lipids:  Lab Results  Component Value Date   CHOL 176 01/28/2021   CHOL 192 02/27/2019   CHOL 189 02/04/2018   Lab Results  Component Value Date   HDL 47 01/28/2021   HDL 44 02/27/2019   HDL 48 02/04/2018   Lab Results  Component Value Date   LDLCALC 110 (H) 01/28/2021   LDLCALC 125 (H) 02/27/2019   LDLCALC 125 (H) 02/04/2018   Lab Results  Component Value Date   TRIG 96 01/28/2021  TRIG 121 02/27/2019   TRIG 68 02/04/2018   Lab Results  Component Value Date   CHOLHDL 3.7 01/28/2021   CHOLHDL 4.4 02/27/2019   CHOLHDL 3.9 02/04/2018   No results found for: "LDLDIRECT" Glucose:  Glucose, Bld  Date Value Ref Range Status  01/28/2021 71 65 - 99 mg/dL Final    Comment:    .            Fasting reference interval .   02/27/2019 86 65 - 99 mg/dL Final    Comment:    .            Fasting reference interval .   02/04/2018 78 65 - 99 mg/dL Final    Comment:    .            Fasting reference interval .     [place] Office Visit from 01/28/20[facility] in [facility]  AUDIT-C Score 0      Married STD testing and prevention (HIV/chl/gon/syphilis):  no Hep C Screening: negative 08/2016 Skin cancer: Discussed monitoring for atypical lesions Colorectal cancer: colonoscopy 6/21 - repeat in 5 years  Prostate cancer:  yes Lab Results  Component Value Date   PSA 0.56 01/28/2021   PSA 1.3 02/27/2019   PSA 0.9 02/04/2018   Lung cancer:  Low Dose CT Chest recommended if Age 49-80 years, 30 pack-year currently smoking OR have quit w/in 15years. Patient  not applicable a candidate for screening   AAA: The USPSTF  recommends one-time screening with ultrasonography in men ages 5 to 9 years who have ever smoked. Patient   no, a candidate for screening   Vaccines:   HPV: n/a Tdap: 10/2013 Shingrix: 2019, 2022 Pneumonia: N/a Flu: yearly at work  COVID-19: No   Advanced Care Planning: A voluntary discussion about advance care planning including the explanation and discussion of advance directives.  Discussed health care proxy and Living will, and the patient was able to identify a health care proxy as wife Brandon Howell.  Patient does not have a living will and power of attorney of health care.   Patient Active Problem List   Diagnosis Date Noted   Personal history of colonic polyps    Polyp of transverse colon    Hyperlipidemia 09/09/2016    Past Surgical History:  Procedure Laterality Date   COLONOSCOPY WITH PROPOFOL N/A 02/09/2020   Procedure: COLONOSCOPY WITH PROPOFOL;  Surgeon: Lucilla Lame, MD;  Location: [facility];  Service: Endoscopy;  Laterality: N/A;  priority 4   EYE SURGERY Bilateral 08/31/1965   POLYPECTOMY N/A 02/09/2020   Procedure: POLYPECTOMY;  Surgeon: Lucilla Lame, MD;  Location: [facility];  Service: Endoscopy;  Laterality: N/A;    Family History  Adopted: Yes  Problem Relation Age of Onset   Thyroid disease Mother    Hyperlipidemia Mother    Allergic rhinitis Brother    Allergic rhinitis Brother     Social History   Socioeconomic History   Marital status: Married    Spouse name: Brandon Howell   Number of children: Not on file   Years of education: 14   Highest education level: Some college, no degree  Occupational History   Not on file  Tobacco Use   Smoking status: Never   Smokeless tobacco: Former  Scientific laboratory technician Use: Never used  Substance and Sexual Activity   Alcohol use: Yes    Comment: occasional   Drug use: No   Sexual activity: Yes  Other Topics Concern   Not on file  Social History Narrative   Not on file   Social Determinants  of Health   Financial Resource Strain: Low Risk  (01/28/2021)   Overall Financial Resource Strain (CARDIA)    Difficulty of Paying Living Expenses: Not hard at all  Food Insecurity: No Food Insecurity (01/28/2021)   Hunger Vital Sign    Worried About Running Out of Food in the Last Year: Never true    Ran Out of Food in the Last Year: Never true  Transportation Needs: No Transportation Needs (01/28/2021)   PRAPARE - Hydrologist (Medical): No    Lack of Transportation (Non-Medical): No  Physical Activity: Insufficiently Active (01/28/2021)   Exercise Vital Sign    Days of Exercise per Week: 3 days    Minutes of Exercise per Session: 40 min  Stress: No Stress Concern Present (01/28/2021)   [place]    Feeling of Stress : Only a little  Social Connections: Moderately Isolated (01/28/2021)   Social Connection and Isolation Panel [NHANES]    Frequency of Communication with Friends and Family: More than three times a week    Frequency of Social Gatherings with Friends and Family: Once a week    Attends Religious Services: Never    Marine scientist or Organizations: No    Attends Archivist Meetings: Never    Marital Status: Married  Human resources officer Violence: Not At Risk (03/16/2022)   Humiliation, Afraid, Rape, and Kick questionnaire    Fear of Current or Ex-Partner: No    Emotionally Abused: No    Physically Abused: No    Sexually Abused: No     Current Outpatient Medications:    Cholecalciferol (VITAMIN D3) 50 MCG (2000 UT) TABS, Take by mouth., Disp: , Rfl:    Multiple Vitamin (MULTIVITAMIN) capsule, Take 1 capsule by mouth daily. Vitafusion, Disp: , Rfl:    Zinc Gluconate 50 MG CAPS, Take by mouth., Disp: , Rfl:   No Known Allergies   Review of Systems  All other systems reviewed and are negative.    Objective  Vitals:   03/16/22 0829  BP: 132/84  Pulse: 89   Resp: 16  Temp: 98.3 F (36.8 C)  SpO2: (!) 89%  Weight: 139 lb 3.2 oz (63.1 kg)  Height: '5\' 7"'$  (1.702 m)    Body mass index is 21.8 kg/m.  Physical Exam Constitutional:      Appearance: Normal appearance.  HENT:     Head: Normocephalic and atraumatic.     Mouth/Throat:     Mouth: Mucous membranes are moist.     Pharynx: Oropharynx is clear.  Eyes:     Extraocular Movements: Extraocular movements intact.     Conjunctiva/sclera: Conjunctivae normal.     Pupils: Pupils are equal, round, and reactive to light.  Cardiovascular:     Rate and Rhythm: Normal rate and regular rhythm.  Pulmonary:     Effort: Pulmonary effort is normal.     Breath sounds: Normal breath sounds.  Musculoskeletal:     Right lower leg: No edema.     Left lower leg: No edema.  Skin:    General: Skin is warm and dry.  Neurological:     General: No focal deficit present.     Mental Status: He is alert. Mental status is at baseline.  Psychiatric:        Mood and  Affect: Mood normal.        Behavior: Behavior normal.     No results found for this or any previous visit (from the past 2160 hour(s)).   Fall Risk:    03/16/2022    8:30 AM 01/28/2021    8:02 AM 02/27/2019    9:13 AM 02/04/2018    1:55 PM 09/07/2016    9:32 AM  Fall Risk   Falls in the past year? 0 0 0 No No  Number falls in past yr: 0 0 0    Injury with Fall? 0 0 0    Follow up  Falls evaluation completed        Functional Status Survey: Is the patient deaf or have difficulty hearing?: No Does the patient have difficulty seeing, even when wearing glasses/contacts?: No Does the patient have difficulty concentrating, remembering, or making decisions?: No Does the patient have difficulty walking or climbing stairs?: No Does the patient have difficulty dressing or bathing?: No Does the patient have difficulty doing errands alone such as visiting a doctor's office or shopping?: No    Assessment & Plan  1. Annual visit for  general adult medical examination without abnormal findings/ Lipid screening/Prostate cancer screening/Abnormal TSH: Screenings due, TSH last year was very slightly high at 4.53, will recheck today. Work form filled out for the patient. Follow up in 1 year or sooner as needed.   - CBC w/Diff/Platelet - COMPLETE METABOLIC PANEL WITH GFR - Lipid Profile - PSA - TSH  -Prostate cancer screening and PSA options (with potential risks and benefits of testing vs not testing) were discussed along with recent recs/guidelines. -USPSTF grade A and B recommendations reviewed with patient; age-appropriate recommendations, preventive care, screening tests, etc discussed and encouraged; healthy living encouraged; see AVS for patient education given to patient -Discussed importance of 150 minutes of physical activity weekly, eat two servings of fish weekly, eat one serving of tree nuts ( cashews, pistachios, pecans, almonds.Marland Kitchen) every other day, eat 6 servings of fruit/vegetables daily and drink plenty of water and avoid sweet beverages.  -Reviewed Health Maintenance: yes

## 2022-03-17 ENCOUNTER — Ambulatory Visit: Payer: Self-pay | Admitting: *Deleted

## 2022-03-17 LAB — CBC WITH DIFFERENTIAL/PLATELET
Absolute Monocytes: 440 cells/uL (ref 200–950)
Basophils Absolute: 42 cells/uL (ref 0–200)
Basophils Relative: 0.8 %
Eosinophils Absolute: 148 cells/uL (ref 15–500)
Eosinophils Relative: 2.8 %
HCT: 44.8 % (ref 38.5–50.0)
Hemoglobin: 15.3 g/dL (ref 13.2–17.1)
Lymphs Abs: 1373 cells/uL (ref 850–3900)
MCH: 30.2 pg (ref 27.0–33.0)
MCHC: 34.2 g/dL (ref 32.0–36.0)
MCV: 88.4 fL (ref 80.0–100.0)
MPV: 10 fL (ref 7.5–12.5)
Monocytes Relative: 8.3 %
Neutro Abs: 3297 cells/uL (ref 1500–7800)
Neutrophils Relative %: 62.2 %
Platelets: 262 10*3/uL (ref 140–400)
RBC: 5.07 10*6/uL (ref 4.20–5.80)
RDW: 12.7 % (ref 11.0–15.0)
Total Lymphocyte: 25.9 %
WBC: 5.3 10*3/uL (ref 3.8–10.8)

## 2022-03-17 LAB — COMPLETE METABOLIC PANEL WITH GFR
AG Ratio: 1.8 (calc) (ref 1.0–2.5)
ALT: 25 U/L (ref 9–46)
AST: 21 U/L (ref 10–35)
Albumin: 4.3 g/dL (ref 3.6–5.1)
Alkaline phosphatase (APISO): 72 U/L (ref 35–144)
BUN: 13 mg/dL (ref 7–25)
CO2: 30 mmol/L (ref 20–32)
Calcium: 9.6 mg/dL (ref 8.6–10.3)
Chloride: 103 mmol/L (ref 98–110)
Creat: 0.92 mg/dL (ref 0.70–1.30)
Globulin: 2.4 g/dL (calc) (ref 1.9–3.7)
Glucose, Bld: 81 mg/dL (ref 65–99)
Potassium: 4.9 mmol/L (ref 3.5–5.3)
Sodium: 143 mmol/L (ref 135–146)
Total Bilirubin: 0.5 mg/dL (ref 0.2–1.2)
Total Protein: 6.7 g/dL (ref 6.1–8.1)
eGFR: 96 mL/min/{1.73_m2} (ref >=60)

## 2022-03-17 LAB — LIPID PANEL
Cholesterol: 181 mg/dL (ref <200)
HDL: 49 mg/dL (ref >=40)
LDL Cholesterol (Calc): 114 mg/dL (calc) — ABNORMAL HIGH
Non-HDL Cholesterol (Calc): 132 mg/dL (calc) — ABNORMAL HIGH (ref <130)
Total CHOL/HDL Ratio: 3.7 (calc) (ref <5.0)
Triglycerides: 82 mg/dL (ref <150)

## 2022-03-17 LAB — TSH: TSH: 2.66 mIU/L (ref 0.40–4.50)

## 2022-03-17 LAB — PSA: PSA: 0.64 ng/mL (ref <=4.00)

## 2022-03-17 NOTE — Telephone Encounter (Signed)
Reason for Disposition . [1] Follow-up call to recent contact AND [2] information only call, no triage required  Answer Assessment - Initial Assessment Questions 1. REASON FOR CALL or QUESTION: "What is your reason for calling today?" or "How can I best help you?" or "What question do you have that I can help answer?"     Pt returned call and was given his lab results from Dr. Rosana Berger dated 03/17/2022 at 9:15 AM.  He will work on bringing his LDL down with diet and exercise before trying medication.  Protocols used: Information Only Call - No Triage-A-AH

## 2023-03-21 NOTE — Progress Notes (Deleted)
Name: Brandon Howell   MRN: [id number]    DOB: 10/06/1960   Date:03/21/2023       Progress Note  Subjective  Chief Complaint  No chief complaint on file.   HPI  Patient presents for annual CPE.  IPSS Questionnaire (AUA-7): Over the past month.   1)  How often have you had a sensation of not emptying your bladder completely after you finish urinating?  1 - Less than 1 time in 5  2)  How often have you had to urinate again less than two hours after you finished urinating? 1 - Less than 1 time in 5  3)  How often have you found you stopped and started again several times when you urinated?  0 - Not at all  4) How difficult have you found it to postpone urination?  0 - Not at all  5) How often have you had a weak urinary stream?  1 - Less than 1 time in 5  6) How often have you had to push or strain to begin urination?  0 - Not at all  7) How many times did you most typically get up to urinate from the time you went to bed until the time you got up in the morning?  0 - None  Total score:  0-7 mildly symptomatic   8-19 moderately symptomatic   20-35 severely symptomatic     Diet: well rounded - skips lunch, eats a lot of vegetables  Exercise: no regular exercise - walks some at work   Depression: phq 9 is negative    03/16/2022    8:33 AM 01/28/2021    8:03 AM 02/27/2019    9:13 AM 02/04/2018    1:56 PM 02/04/2018    1:55 PM  Depression screen PHQ 2/9  Decreased Interest 0 0 0 0 0  Down, Depressed, Hopeless 0 0 0 0 1  PHQ - 2 Score 0 0 0 0 1  Altered sleeping 0 0 0 0   Tired, decreased energy 0 0 0 0   Change in appetite 0 0 0 0   Feeling bad or failure about yourself  0 0 0 0   Trouble concentrating 0 0 0 0   Moving slowly or fidgety/restless 0 0 0 0   Suicidal thoughts 0 0 0 0   PHQ-9 Score 0 0 0 0   Difficult doing work/chores Not difficult at all Not difficult at all Not difficult at all Not difficult at all     Hypertension:  BP Readings from Last 3 Encounters:   03/16/22 132/84  01/28/21 118/76  02/09/20 136/85    Obesity: Wt Readings from Last 3 Encounters:  03/16/22 139 lb 3.2 oz (63.1 kg)  01/28/21 140 lb 6.4 oz (63.7 kg)  02/09/20 139 lb (63 kg)   BMI Readings from Last 3 Encounters:  03/16/22 21.80 kg/m  01/28/21 21.99 kg/m  02/09/20 21.77 kg/m     Lipids:  Lab Results  Component Value Date   CHOL 181 03/16/2022   CHOL 176 01/28/2021   CHOL 192 02/27/2019   Lab Results  Component Value Date   HDL 49 03/16/2022   HDL 47 01/28/2021   HDL 44 02/27/2019   Lab Results  Component Value Date   LDLCALC 114 (H) 03/16/2022   LDLCALC 110 (H) 01/28/2021   LDLCALC 125 (H) 02/27/2019   Lab Results  Component Value Date   TRIG 82 03/16/2022   TRIG 96 01/28/2021  TRIG 121 02/27/2019   Lab Results  Component Value Date   CHOLHDL 3.7 03/16/2022   CHOLHDL 3.7 01/28/2021   CHOLHDL 4.4 02/27/2019   No results found for: "LDLDIRECT" Glucose:  Glucose, Bld  Date Value Ref Range Status  03/16/2022 81 65 - 99 mg/dL Final    Comment:    .            Fasting reference interval .   01/28/2021 71 65 - 99 mg/dL Final    Comment:    .            Fasting reference interval .   02/27/2019 86 65 - 99 mg/dL Final    Comment:    .            Fasting reference interval .     Flowsheet Row Office Visit from 01/28/2021 in [facility]  AUDIT-C Score 0      Married STD testing and prevention (HIV/chl/gon/syphilis):  no Hep C Screening: negative 08/2016 Skin cancer: Discussed monitoring for atypical lesions Colorectal cancer: colonoscopy 6/21 - repeat in 5 years  Prostate cancer:  yes Lab Results  Component Value Date   PSA 0.64 03/16/2022   PSA 0.56 01/28/2021   PSA 1.3 02/27/2019   Lung cancer:  Low Dose CT Chest recommended if Age 32-80 years, 30 pack-year currently smoking OR have quit w/in 15years. Patient  not applicable a candidate for screening   AAA: The USPSTF recommends one-time  screening with ultrasonography in men ages 72 to 75 years who have ever smoked. Patient   no, a candidate for screening   Vaccines:   HPV: n/a Tdap: 10/2013 Shingrix: 2019, 2022 Pneumonia: N/a Flu: yearly at work  COVID-19: No   Advanced Care Planning: A voluntary discussion about advance care planning including the explanation and discussion of advance directives.  Discussed health care proxy and Living will, and the patient was able to identify a health care proxy as wife Darrell Hauk.  Patient does not have a living will and power of attorney of health care.   Patient Active Problem List   Diagnosis Date Noted   Personal history of colonic polyps    Polyp of transverse colon    Hyperlipidemia 09/09/2016    Past Surgical History:  Procedure Laterality Date   COLONOSCOPY WITH PROPOFOL N/A 02/09/2020   Procedure: COLONOSCOPY WITH PROPOFOL;  Surgeon: Midge Minium, MD;  Location: [facility];  Service: Endoscopy;  Laterality: N/A;  priority 4   EYE SURGERY Bilateral 08/31/1965   POLYPECTOMY N/A 02/09/2020   Procedure: POLYPECTOMY;  Surgeon: Midge Minium, MD;  Location[facility];  Service: Endoscopy;  Laterality: N/A;    Family History  Adopted: Yes  Problem Relation Age of Onset   Thyroid disease Mother    Hyperlipidemia Mother    Allergic rhinitis Brother    Allergic rhinitis Brother     Social History   Socioeconomic History   Marital status: Married    Spouse name: Leigh   Number of children: Not on file   Years of education: 14   Highest education level: Some college, no degree  Occupational History   Not on file  Tobacco Use   Smoking status: Never   Smokeless tobacco: Former    Types: Engineer, drilling   Vaping status: Never Used  Substance and Sexual Activity   Alcohol use: Yes    Comment: occasional   Drug use: No   Sexual activity:  Yes  Other Topics Concern   Not on file  Social History Narrative   Not on file   Social Determinants  of Health   Financial Resource Strain: Low Risk  (03/16/2022)   Overall Financial Resource Strain (CARDIA)    Difficulty of Paying Living Expenses: Not hard at all  Food Insecurity: No Food Insecurity (03/16/2022)   Hunger Vital Sign    Worried About Running Out of Food in the Last Year: Never true    Ran Out of Food in the Last Year: Never true  Transportation Needs: No Transportation Needs (03/16/2022)   PRAPARE - Administrator, Civil Service (Medical): No    Lack of Transportation (Non-Medical): No  Physical Activity: Insufficiently Active (03/16/2022)   Exercise Vital Sign    Days of Exercise per Week: 2 days    Minutes of Exercise per Session: 30 min  Stress: No Stress Concern Present (03/16/2022)   [employer] of Occupational Health - Occupational Stress Questionnaire    Feeling of Stress : Only a little  Social Connections: Moderately Isolated (03/16/2022)   Social Connection and Isolation Panel [NHANES]    Frequency of Communication with Friends and Family: Three times a week    Frequency of Social Gatherings with Friends and Family: Once a week    Attends Religious Services: Never    Database administrator or Organizations: No    Attends Banker Meetings: Never    Marital Status: Married  Catering manager Violence: Not At Risk (03/16/2022)   Humiliation, Afraid, Rape, and Kick questionnaire    Fear of Current or Ex-Partner: No    Emotionally Abused: No    Physically Abused: No    Sexually Abused: No     Current Outpatient Medications:    Cholecalciferol (VITAMIN D3) 50 MCG (2000 UT) TABS, Take by mouth., Disp: , Rfl:    Multiple Vitamin (MULTIVITAMIN) capsule, Take 1 capsule by mouth daily. Vitafusion, Disp: , Rfl:    Zinc Gluconate 50 MG CAPS, Take by mouth., Disp: , Rfl:   No Known Allergies   Review of Systems  All other systems reviewed and are negative.    Objective  There were no vitals filed for this visit.   There is no  height or weight on file to calculate BMI.  Physical Exam Constitutional:      Appearance: Normal appearance.  HENT:     Head: Normocephalic and atraumatic.     Mouth/Throat:     Mouth: Mucous membranes are moist.     Pharynx: Oropharynx is clear.  Eyes:     Extraocular Movements: Extraocular movements intact.     Conjunctiva/sclera: Conjunctivae normal.     Pupils: Pupils are equal, round, and reactive to light.  Cardiovascular:     Rate and Rhythm: Normal rate and regular rhythm.  Pulmonary:     Effort: Pulmonary effort is normal.     Breath sounds: Normal breath sounds.  Musculoskeletal:     Right lower leg: No edema.     Left lower leg: No edema.  Skin:    General: Skin is warm and dry.  Neurological:     General: No focal deficit present.     Mental Status: He is alert. Mental status is at baseline.  Psychiatric:        Mood and Affect: Mood normal.        Behavior: Behavior normal.     No results found for this or any previous visit (  from the past 2160 hour(s)).   Fall Risk:    03/16/2022    8:30 AM 01/28/2021    8:02 AM 02/27/2019    9:13 AM 02/04/2018    1:55 PM 09/07/2016    9:32 AM  Fall Risk   Falls in the past year? 0 0 0 No No  Number falls in past yr: 0 0 0    Injury with Fall? 0 0 0    Follow up  Falls evaluation completed        Functional Status Survey:      Assessment & Plan  1. Annual visit for general adult medical examination without abnormal findings/ Lipid screening/Prostate cancer screening/Abnormal TSH: Screenings due, TSH last year was very slightly high at 4.53, will recheck today. Work form filled out for the patient. Follow up in 1 year or sooner as needed.   - CBC w/Diff/Platelet - COMPLETE METABOLIC PANEL WITH GFR - Lipid Profile - PSA - TSH  -Prostate cancer screening and PSA options (with potential risks and benefits of testing vs not testing) were discussed along with recent recs/guidelines. -USPSTF grade A and B  recommendations reviewed with patient; age-appropriate recommendations, preventive care, screening tests, etc discussed and encouraged; healthy living encouraged; see AVS for patient education given to patient -Discussed importance of 150 minutes of physical activity weekly, eat two servings of fish weekly, eat one serving of tree nuts ( cashews, pistachios, pecans, almonds.Marland Kitchen) every other day, eat 6 servings of fruit/vegetables daily and drink plenty of water and avoid sweet beverages.  -Reviewed Health Maintenance: yes

## 2023-03-22 ENCOUNTER — Ambulatory Visit: Payer: 59 | Admitting: Internal Medicine

## 2023-06-07 ENCOUNTER — Encounter: Payer: Self-pay | Admitting: Internal Medicine

## 2023-06-07 ENCOUNTER — Ambulatory Visit (INDEPENDENT_AMBULATORY_CARE_PROVIDER_SITE_OTHER): Payer: BC Managed Care – PPO | Admitting: Internal Medicine

## 2023-06-07 VITALS — BP 122/82 | HR 80 | Temp 97.9°F | Resp 18 | Ht 67.0 in | Wt 137.0 lb

## 2023-06-07 DIAGNOSIS — Z563 Stressful work schedule: Secondary | ICD-10-CM | POA: Diagnosis not present

## 2023-06-07 DIAGNOSIS — Z0000 Encounter for general adult medical examination without abnormal findings: Secondary | ICD-10-CM

## 2023-06-07 DIAGNOSIS — Z125 Encounter for screening for malignant neoplasm of prostate: Secondary | ICD-10-CM | POA: Diagnosis not present

## 2023-06-07 DIAGNOSIS — E559 Vitamin D deficiency, unspecified: Secondary | ICD-10-CM | POA: Diagnosis not present

## 2023-06-07 DIAGNOSIS — E785 Hyperlipidemia, unspecified: Secondary | ICD-10-CM | POA: Diagnosis not present

## 2023-06-07 DIAGNOSIS — Z23 Encounter for immunization: Secondary | ICD-10-CM | POA: Diagnosis not present

## 2023-06-07 DIAGNOSIS — Z0001 Encounter for general adult medical examination with abnormal findings: Secondary | ICD-10-CM

## 2023-06-07 NOTE — Patient Instructions (Signed)
Fluoxetine Capsules or Tablets (Depression/Mood Disorders) What is this medication? FLUOXETINE (floo OX e teen) treats depression, anxiety, obsessive-compulsive disorder (OCD), and eating disorders. It increases the amount of serotonin in the brain, a hormone that helps regulate mood. It belongs to a group of medications called SSRIs. This medicine may be used for other purposes; ask your health care provider or pharmacist if you have questions. COMMON BRAND NAME(S): Prozac What should I tell my care team before I take this medication? They need to know if you have any of these conditions: Bipolar disorder or a family history of bipolar disorder Bleeding disorder Glaucoma Heart disease Liver disease Low levels of sodium in the blood Seizures Suicidal thoughts, plans, or attempt by you or a family member Take an MAOI, such as Carbex, Eldepryl, Marplan, Nardil, or Parnate Take medications that treat or prevent blood clots Thyroid disease An unusual or allergic reaction to fluoxetine, other medications, foods, dyes, or preservatives Pregnant or trying to get pregnant Breastfeeding How should I use this medication? Take this medication by mouth with a glass of water. Follow the directions on the prescription label. You can take this medication with or without food. Take your medication at regular intervals. Do not take it more often than directed. Do not stop taking this medication suddenly except upon the advice of your care team. Stopping this medication too quickly may cause serious side effects or your condition may worsen. A special MedGuide will be given to you by the pharmacist with each prescription and refill. Be sure to read this information carefully each time. Talk to your care team about the use of this medication in children. While it may be prescribed for children as young as 7 years for selected conditions, precautions do apply. Overdosage: If you think you have taken too much of  this medicine contact a poison control center or emergency room at once. NOTE: This medicine is only for you. Do not share this medicine with others. What if I miss a dose? If you miss a dose, skip the missed dose and go back to your regular dosing schedule. Do not take double or extra doses. What may interact with this medication? Do not take this medication with any of the following: Other medications containing fluoxetine, such as Sarafem or Symbyax Cisapride Dronedarone Linezolid MAOIs, such as Carbex, Eldepryl, Marplan, Nardil, and Parnate Methylene blue (injected into a vein) Pimozide Thioridazine This medication may also interact with the following: Alcohol Amphetamines Aspirin and aspirin-like medications Carbamazepine Certain medications for mental health conditions Certain medications for migraine headache, such as almotriptan, eletriptan, frovatriptan, naratriptan, rizatriptan, sumatriptan, zolmitriptan Digoxin Diuretics Fentanyl Flecainide Furazolidone Isoniazid Lithium Medications that help you fall asleep Medications that treat or prevent blood clots, such as warfarin, enoxaparin, and dalteparin NSAIDs, medications for pain and inflammation, such as ibuprofen or naproxen Other medications that cause heart rhythm changes Phenytoin Procarbazine Propafenone Rasagiline Ritonavir Supplements, such as St. John's wort, kava kava, valerian Tramadol Tryptophan Vinblastine This list may not describe all possible interactions. Give your health care provider a list of all the medicines, herbs, non-prescription drugs, or dietary supplements you use. Also tell them if you smoke, drink alcohol, or use illegal drugs. Some items may interact with your medicine. What should I watch for while using this medication? Tell your care team if your symptoms do not get better or if they get worse. Visit your care team for regular checks on your progress. Because it may take several  weeks to see  the full effects of this medication, it is important to continue your treatment as prescribed. Watch for new or worsening thoughts of suicide or depression. This includes sudden changes in mood, behavior, or thoughts. These changes can happen at any time but are more common in the beginning of treatment or after a change in dose. Call your care team right away if you experience these thoughts or worsening depression. This medication may cause mood and behavior changes, such as anxiety, nervousness, irritability, hostility, restlessness, excitability, hyperactivity, or trouble sleeping. These changes can happen at any time but are more common in the beginning of treatment or after a change in dose. Call your care team right away if you notice any of these symptoms. This medication may affect your coordination, reaction time, or judgment. Do not drive or operate machinery until you know how this medication affects you. Sit up or stand slowly to reduce the risk of dizzy or fainting spells. Drinking alcohol with this medication can increase the risk of these side effects. Your mouth may get dry. Chewing sugarless gum or sucking hard candy and drinking plenty of water may help. Contact your care team if the problem does not go away or is severe. This medication may affect blood sugar levels. If you have diabetes, check with your care team before you make changes to your diet or medications. What side effects may I notice from receiving this medication? Side effects that you should report to your care team as soon as possible: Allergic reactions--skin rash, itching, hives, swelling of the face, lips, tongue, or throat Bleeding--bloody or black, tar-like stools, red or dark brown urine, vomiting blood or brown material that looks like coffee grounds, small, red or purple spots on skin, unusual bleeding or bruising Heart rhythm changes--fast or irregular heartbeat, dizziness, feeling faint or  lightheaded, chest pain, trouble breathing Loss of appetite with weight loss Low sodium level--muscle weakness, fatigue, dizziness, headache, confusion Serotonin syndrome--irritability, confusion, fast or irregular heartbeat, muscle stiffness, twitching muscles, sweating, high fever, seizure, chills, vomiting, diarrhea Sudden eye pain or change in vision such as blurry vision, seeing halos around lights, vision loss Thoughts of suicide or self-harm, worsening mood, feelings of depression Side effects that usually do not require medical attention (report to your care team if they continue or are bothersome): Anxiety, nervousness Change in sex drive or performance Diarrhea Dry mouth Headache Excessive sweating Nausea Tremors or shaking Trouble sleeping Upset stomach This list may not describe all possible side effects. Call your doctor for medical advice about side effects. You may report side effects to FDA at 1-800-FDA-1088. Where should I keep my medication? Keep out of the reach of children and pets. Store at room temperature between 15 and 30 degrees C (59 and 86 degrees F). Get rid of any unused medication after the expiration date. NOTE: This sheet is a summary. It may not cover all possible information. If you have questions about this medicine, talk to your doctor, pharmacist, or health care provider.  2024 Elsevier/Gold Standard (2022-06-02 00:00:00)

## 2023-06-08 LAB — COMPLETE METABOLIC PANEL WITH GFR
AG Ratio: 1.7 (calc) (ref 1.0–2.5)
ALT: 32 U/L (ref 9–46)
AST: 26 U/L (ref 10–35)
Albumin: 4.1 g/dL (ref 3.6–5.1)
Alkaline phosphatase (APISO): 80 U/L (ref 35–144)
BUN: 19 mg/dL (ref 7–25)
CO2: 28 mmol/L (ref 20–32)
Calcium: 9.5 mg/dL (ref 8.6–10.3)
Chloride: 105 mmol/L (ref 98–110)
Creat: 0.79 mg/dL (ref 0.70–1.35)
Globulin: 2.4 g/dL (ref 1.9–3.7)
Glucose, Bld: 86 mg/dL (ref 65–99)
Potassium: 5 mmol/L (ref 3.5–5.3)
Sodium: 145 mmol/L (ref 135–146)
Total Bilirubin: 0.5 mg/dL (ref 0.2–1.2)
Total Protein: 6.5 g/dL (ref 6.1–8.1)
eGFR: 102 mL/min/{1.73_m2} (ref >=60)

## 2023-06-08 LAB — CBC WITH DIFFERENTIAL/PLATELET
Absolute Monocytes: 455 {cells}/uL (ref 200–950)
Basophils Absolute: 20 {cells}/uL (ref 0–200)
Basophils Relative: 0.4 %
Eosinophils Absolute: 190 {cells}/uL (ref 15–500)
Eosinophils Relative: 3.8 %
HCT: 49.4 % (ref 38.5–50.0)
Hemoglobin: 15.8 g/dL (ref 13.2–17.1)
Lymphs Abs: 1540 {cells}/uL (ref 850–3900)
MCH: 29.6 pg (ref 27.0–33.0)
MCHC: 32 g/dL (ref 32.0–36.0)
MCV: 92.5 fL (ref 80.0–100.0)
MPV: 10.2 fL (ref 7.5–12.5)
Monocytes Relative: 9.1 %
Neutro Abs: 2795 {cells}/uL (ref 1500–7800)
Neutrophils Relative %: 55.9 %
Platelets: 273 10*3/uL (ref 140–400)
RBC: 5.34 10*6/uL (ref 4.20–5.80)
RDW: 12.7 % (ref 11.0–15.0)
Total Lymphocyte: 30.8 %
WBC: 5 10*3/uL (ref 3.8–10.8)

## 2023-06-08 LAB — LIPID PANEL
Cholesterol: 178 mg/dL (ref <200)
HDL: 45 mg/dL (ref >=40)
LDL Cholesterol (Calc): 114 mg/dL — ABNORMAL HIGH
Non-HDL Cholesterol (Calc): 133 mg/dL — ABNORMAL HIGH (ref <130)
Total CHOL/HDL Ratio: 4 (calc) (ref <5.0)
Triglycerides: 91 mg/dL (ref <150)

## 2023-06-08 LAB — TSH: TSH: 3.32 m[IU]/L (ref 0.40–4.50)

## 2023-06-08 LAB — VITAMIN D 25 HYDROXY (VIT D DEFICIENCY, FRACTURES): Vit D, 25-Hydroxy: 96 ng/mL (ref 30–100)

## 2023-06-08 LAB — PSA: PSA: 0.84 ng/mL (ref <=4.00)

## 2024-05-12 ENCOUNTER — Encounter: Payer: Self-pay | Admitting: Family Medicine

## 2024-06-09 ENCOUNTER — Ambulatory Visit (INDEPENDENT_AMBULATORY_CARE_PROVIDER_SITE_OTHER): Payer: Self-pay | Admitting: Internal Medicine

## 2024-06-09 ENCOUNTER — Encounter: Payer: Self-pay | Admitting: Internal Medicine

## 2024-06-09 VITALS — BP 120/72 | HR 86 | Temp 98.1°F | Resp 16 | Ht 67.0 in | Wt 133.3 lb

## 2024-06-09 DIAGNOSIS — Z125 Encounter for screening for malignant neoplasm of prostate: Secondary | ICD-10-CM | POA: Diagnosis not present

## 2024-06-09 DIAGNOSIS — Z13220 Encounter for screening for lipoid disorders: Secondary | ICD-10-CM

## 2024-06-09 DIAGNOSIS — E559 Vitamin D deficiency, unspecified: Secondary | ICD-10-CM

## 2024-06-09 DIAGNOSIS — Z0000 Encounter for general adult medical examination without abnormal findings: Secondary | ICD-10-CM | POA: Diagnosis not present

## 2024-06-09 DIAGNOSIS — Z0001 Encounter for general adult medical examination with abnormal findings: Secondary | ICD-10-CM

## 2024-06-09 DIAGNOSIS — Z23 Encounter for immunization: Secondary | ICD-10-CM

## 2024-06-09 DIAGNOSIS — Z1211 Encounter for screening for malignant neoplasm of colon: Secondary | ICD-10-CM

## 2024-06-09 NOTE — Progress Notes (Signed)
 Name: Brandon Howell   MRN: [id number]    DOB: Jun 24, 1961   Date:06/09/2024       Progress Note  Subjective  Chief Complaint  Chief Complaint  Patient presents with   Annual Exam    HPI  Patient presents for annual CPE .  Discussed the use of AI scribe software for clinical note transcription with the patient, who gave verbal consent to proceed.  History of Present Illness Brandon Howell Brandon Howell is a 63 year old male who presents for an annual physical exam.  He received a tetanus vaccine today and is considering a flu vaccine. He has received the shingles vaccine, with significant side effects after the second dose that resolved by the next morning.  He maintains a well-rounded diet, cooking at home and avoiding fast food. He is generally active despite a desk job.  A colonoscopy in 2021 revealed non-dangerous polyps. He humorously recalls the experience as a 'vacation' from work stress.  He does not qualify for lung cancer screening as he has never smoked, though he used to chew tobacco years ago.  He takes a multivitamin, vitamin D , and zinc supplements. He has noticed a few varicose veins and is aware of the benefits of leg elevation and compression stockings.  Family history includes shingles, with his mother experiencing it during pregnancy, and thyroid issues, though he is unsure if his mother is on medication.    IPSS     Row Name 06/09/24 1023         International Prostate Symptom Score   How often have you had the sensation of not emptying your bladder? Not at All     How often have you had to urinate less than every two hours? Not at All     How often have you found you stopped and started again several times when you urinated? Not at All     How often have you found it difficult to postpone urination? Not at All     How often have you had a weak urinary stream? Not at All     How often have you had to strain to start urination? Not at All     How many  times did you typically get up at night to urinate? None     Total IPSS Score 0       Quality of Life due to urinary symptoms   If you were to spend the rest of your life with your urinary condition just the way it is now how would you feel about that? Delighted        Diet: Regular - well rounded, cooking at home  Exercise: None but active  Last Dental Exam: Completed  Last Eye Exam: will schedule  Depression: phq 9 is negative    06/09/2024   10:20 AM 06/07/2023    8:08 AM 03/16/2022    8:33 AM 01/28/2021    8:03 AM 02/27/2019    9:13 AM  Depression screen PHQ 2/9  Decreased Interest 0 0 0 0 0  Down, Depressed, Hopeless 0 0 0 0 0  PHQ - 2 Score 0 0 0 0 0  Altered sleeping  0 0 0 0  Tired, decreased energy  0 0 0 0  Change in appetite  0 0 0 0  Feeling bad or failure about yourself   0 0 0 0  Trouble concentrating  0 0 0 0  Moving slowly or fidgety/restless  0 0 0  0  Suicidal thoughts  0 0 0 0  PHQ-9 Score  0 0 0 0  Difficult doing work/chores  Not difficult at all Not difficult at all Not difficult at all Not difficult at all    Hypertension:  BP Readings from Last 3 Encounters:  06/09/24 120/72  06/07/23 122/82  03/16/22 132/84    Obesity: Wt Readings from Last 3 Encounters:  06/09/24 133 lb 4.8 oz (60.5 kg)  06/07/23 137 lb (62.1 kg)  03/16/22 139 lb 3.2 oz (63.1 kg)   BMI Readings from Last 3 Encounters:  06/09/24 20.88 kg/m  06/07/23 21.46 kg/m  03/16/22 21.80 kg/m     Flowsheet Row Office Visit from 06/09/2024 in [facility]  AUDIT-C Score 0    Married STD testing and prevention (HIV/chl/gon/syphilis):  no Hep C Screening: completed Skin cancer: Discussed monitoring for atypical lesions Colorectal cancer: 02/09/2020, repeat in 5 years will place referral today Prostate cancer:  yes Lab Results  Component Value Date   PSA 0.84 06/07/2023   PSA 0.64 03/16/2022   PSA 0.56 01/28/2021    Lung cancer:  Low Dose CT  Chest recommended if Age 45-80 years, 30 pack-year currently smoking OR have quit w/in 15years. Patient  is not a candidate for screening   AAA: The USPSTF recommends one-time screening with ultrasonography in men ages 18 to 75 years who have ever smoked. Patient   is not a candidate for screening    Vaccines: reviewed with the patient. Tdap given today.   Advanced Care Planning: A voluntary discussion about advance care planning including the explanation and discussion of advance directives.  Discussed health care proxy and Living will, and the patient was able to identify a health care proxy as Brandon Howell (wife).  Patient does not have a living will and power of attorney of health care   Patient Active Problem List   Diagnosis Date Noted   History of colonic polyps    Polyp of transverse colon    Hyperlipidemia 09/09/2016    Past Surgical History:  Procedure Laterality Date   COLONOSCOPY WITH PROPOFOL  N/A 02/09/2020   Procedure: COLONOSCOPY WITH PROPOFOL ;  Surgeon: Jinny Carmine, MD;  Location: [facility];  Service: Endoscopy;  Laterality: N/A;  priority 4   EYE SURGERY Bilateral 08/31/1965   POLYPECTOMY N/A 02/09/2020   Procedure: POLYPECTOMY;  Surgeon: Jinny Carmine, MD;  Location: [facility];  Service: Endoscopy;  Laterality: N/A;    Family History  Adopted: Yes  Problem Relation Age of Onset   Thyroid disease Mother    Hyperlipidemia Mother    Allergic rhinitis Brother    Allergic rhinitis Brother     Social History   Socioeconomic History   Marital status: Married    Spouse name: Leigh   Number of children: Not on file   Years of education: 14   Highest education level: Some college, no degree  Occupational History   Not on file  Tobacco Use   Smoking status: Never   Smokeless tobacco: Former    Types: Engineer, drilling   Vaping status: Never Used  Substance and Sexual Activity   Alcohol use: Yes    Comment: occasional   Drug use: No   Sexual  activity: Yes  Other Topics Concern   Not on file  Social History Narrative   Not on file   Social Drivers of Health   Financial Resource Strain: Low Risk  (06/09/2024)   Overall Financial  Resource Strain (CARDIA)    Difficulty of Paying Living Expenses: Not very hard  Food Insecurity: No Food Insecurity (06/09/2024)   Hunger Vital Sign    Worried About Running Out of Food in the Last Year: Never true    Ran Out of Food in the Last Year: Never true  Transportation Needs: No Transportation Needs (06/09/2024)   PRAPARE - Administrator, Civil Service (Medical): No    Lack of Transportation (Non-Medical): No  Physical Activity: Inactive (06/09/2024)   Exercise Vital Sign    Days of Exercise per Week: 0 days    Minutes of Exercise per Session: 0 min  Stress: No Stress Concern Present (06/09/2024)   [employer] of Occupational Health - Occupational Stress Questionnaire    Feeling of Stress: Not at all  Social Connections: Moderately Isolated (06/09/2024)   Social Connection and Isolation Panel    Frequency of Communication with Friends and Family: More than three times a week    Frequency of Social Gatherings with Friends and Family: Once a week    Attends Religious Services: Never    Database administrator or Organizations: No    Attends Banker Meetings: Never    Marital Status: Married  Catering manager Violence: Not At Risk (06/09/2024)   Humiliation, Afraid, Rape, and Kick questionnaire    Fear of Current or Ex-Partner: No    Emotionally Abused: No    Physically Abused: No    Sexually Abused: No     Current Outpatient Medications:    Cholecalciferol (VITAMIN D3) 50 MCG (2000 UT) TABS, Take by mouth., Disp: , Rfl:    Multiple Vitamin (MULTIVITAMIN) capsule, Take 1 capsule by mouth daily. Vitafusion, Disp: , Rfl:    Zinc Gluconate 50 MG CAPS, Take by mouth., Disp: , Rfl:   No Known Allergies   Review of Systems  All other systems  reviewed and are negative.    Objective  Vitals:   06/09/24 1016  BP: 120/72  Pulse: 86  Resp: 16  Temp: 98.1 F (36.7 C)  TempSrc: Oral  SpO2: 100%  Weight: 133 lb 4.8 oz (60.5 kg)  Height: 5' 7 (1.702 m)    Body mass index is 20.88 kg/m.  Physical Exam Constitutional:      Appearance: Normal appearance.  HENT:     Head: Normocephalic and atraumatic.     Right Ear: Tympanic membrane, ear canal and external ear normal.     Left Ear: Tympanic membrane, ear canal and external ear normal.     Mouth/Throat:     Mouth: Mucous membranes are moist.     Pharynx: Oropharynx is clear.  Eyes:     Extraocular Movements: Extraocular movements intact.     Conjunctiva/sclera: Conjunctivae normal.     Pupils: Pupils are equal, round, and reactive to light.  Neck:     Comments: No thyromegaly Cardiovascular:     Rate and Rhythm: Normal rate and regular rhythm.  Pulmonary:     Effort: Pulmonary effort is normal.     Breath sounds: Normal breath sounds.  Musculoskeletal:     Cervical back: No tenderness.     Right lower leg: No edema.     Left lower leg: No edema.  Lymphadenopathy:     Cervical: No cervical adenopathy.  Skin:    General: Skin is warm and dry.  Neurological:     General: No focal deficit present.     Mental Status: He is alert. Mental  status is at baseline.  Psychiatric:        Mood and Affect: Mood normal.        Behavior: Behavior normal.     Last CBC Lab Results  Component Value Date   WBC 5.0 06/07/2023   HGB 15.8 06/07/2023   HCT 49.4 06/07/2023   MCV 92.5 06/07/2023   MCH 29.6 06/07/2023   RDW 12.7 06/07/2023   PLT 273 06/07/2023   Last metabolic panel Lab Results  Component Value Date   GLUCOSE 86 06/07/2023   NA 145 06/07/2023   K 5.0 06/07/2023   CL 105 06/07/2023   CO2 28 06/07/2023   BUN 19 06/07/2023   CREATININE 0.79 06/07/2023   EGFR 102 06/07/2023   CALCIUM 9.5 06/07/2023   PROT 6.5 06/07/2023   ALBUMIN 4.7 09/07/2016    BILITOT 0.5 06/07/2023   ALKPHOS 65 09/07/2016   AST 26 06/07/2023   ALT 32 06/07/2023   Last lipids Lab Results  Component Value Date   CHOL 178 06/07/2023   HDL 45 06/07/2023   LDLCALC 114 (H) 06/07/2023   TRIG 91 06/07/2023   CHOLHDL 4.0 06/07/2023   Last hemoglobin A1c No results found for: HGBA1C Last thyroid functions Lab Results  Component Value Date   TSH 3.32 06/07/2023   Last vitamin D  Lab Results  Component Value Date   VD25OH 96 06/07/2023   Last vitamin B12 and Folate No results found for: VITAMINB12, FOLATE    Assessment & Plan  Assessment & Plan Adult Wellness Visit Blood pressure controlled. BMI normal with slight weight loss. Lung cancer screening not needed. - Discussed balanced diet, recommended 150 minutes of cardiovascular exercise weekly. - Administered tetanus vaccine, offered flu vaccine. - Ordered labs: kidney, liver, electrolytes, cholesterol, prostate cancer screening. - Ordered vitamin D  level, discussed potential excess intake. - Referred for colonoscopy in June 2026.  Varicose veins of lower extremity Presence of varicose veins noted. - Discussed management: leg elevation, compression stockings. - Advised against invasive procedures due to potential complications and ineffectiveness.  - Tdap vaccine greater than or equal to 7yo IM - CBC w/Diff/Platelet - Comprehensive Metabolic Panel (CMET) - Lipid Profile - PSA - Ambulatory referral to Gastroenterology - Vitamin D  (25 hydroxy)   -Prostate cancer screening and PSA options (with potential risks and benefits of testing vs not testing) were discussed along with recent recs/guidelines. -USPSTF grade A and B recommendations reviewed with patient; age-appropriate recommendations, preventive care, screening tests, etc discussed and encouraged; healthy living encouraged; see AVS for patient education given to patient -Discussed importance of 150 minutes of physical activity weekly,  eat two servings of fish weekly, eat one serving of tree nuts ( cashews, pistachios, pecans, almonds.SABRA) every other day, eat 6 servings of fruit/vegetables daily and drink plenty of water  and avoid sweet beverages.  -Reviewed Health Maintenance: yes

## 2024-06-10 LAB — LIPID PANEL
Cholesterol: 200 mg/dL — ABNORMAL HIGH (ref <200)
HDL: 49 mg/dL (ref >=40)
LDL Cholesterol (Calc): 134 mg/dL — ABNORMAL HIGH
Non-HDL Cholesterol (Calc): 151 mg/dL — ABNORMAL HIGH (ref <130)
Total CHOL/HDL Ratio: 4.1 (calc) (ref <5.0)
Triglycerides: 74 mg/dL (ref <150)

## 2024-06-10 LAB — COMPREHENSIVE METABOLIC PANEL WITH GFR
AG Ratio: 2.2 (calc) (ref 1.0–2.5)
ALT: 25 U/L (ref 9–46)
AST: 19 U/L (ref 10–35)
Albumin: 4.6 g/dL (ref 3.6–5.1)
Alkaline phosphatase (APISO): 72 U/L (ref 35–144)
BUN: 17 mg/dL (ref 7–25)
CO2: 31 mmol/L (ref 20–32)
Calcium: 9.8 mg/dL (ref 8.6–10.3)
Chloride: 103 mmol/L (ref 98–110)
Creat: 0.76 mg/dL (ref 0.70–1.35)
Globulin: 2.1 g/dL (ref 1.9–3.7)
Glucose, Bld: 85 mg/dL (ref 65–99)
Potassium: 4.7 mmol/L (ref 3.5–5.3)
Sodium: 142 mmol/L (ref 135–146)
Total Bilirubin: 0.5 mg/dL (ref 0.2–1.2)
Total Protein: 6.7 g/dL (ref 6.1–8.1)
eGFR: 102 mL/min/1.73m2 (ref >=60)

## 2024-06-10 LAB — CBC WITH DIFFERENTIAL/PLATELET
Absolute Lymphocytes: 1243 {cells}/uL (ref 850–3900)
Absolute Monocytes: 370 {cells}/uL (ref 200–950)
Basophils Absolute: 30 {cells}/uL (ref 0–200)
Basophils Relative: 0.4 %
Eosinophils Absolute: 148 {cells}/uL (ref 15–500)
Eosinophils Relative: 2 %
HCT: 48.7 % (ref 38.5–50.0)
Hemoglobin: 16.1 g/dL (ref 13.2–17.1)
MCH: 30.4 pg (ref 27.0–33.0)
MCHC: 33.1 g/dL (ref 32.0–36.0)
MCV: 91.9 fL (ref 80.0–100.0)
MPV: 9.9 fL (ref 7.5–12.5)
Monocytes Relative: 5 %
Neutro Abs: 5609 {cells}/uL (ref 1500–7800)
Neutrophils Relative %: 75.8 %
Platelets: 281 Thousand/uL (ref 140–400)
RBC: 5.3 Million/uL (ref 4.20–5.80)
RDW: 13 % (ref 11.0–15.0)
Total Lymphocyte: 16.8 %
WBC: 7.4 Thousand/uL (ref 3.8–10.8)

## 2024-06-10 LAB — PSA: PSA: 0.83 ng/mL (ref <=4.00)

## 2024-06-10 LAB — VITAMIN D 25 HYDROXY (VIT D DEFICIENCY, FRACTURES): Vit D, 25-Hydroxy: 73 ng/mL (ref 30–100)

## 2024-06-12 ENCOUNTER — Ambulatory Visit: Payer: Self-pay | Admitting: Internal Medicine

## 2024-09-15 ENCOUNTER — Other Ambulatory Visit: Payer: Self-pay

## 2024-09-15 ENCOUNTER — Emergency Department
Admission: EM | Admit: 2024-09-15 | Discharge: 2024-09-15 | Disposition: A | Discharge status: Home / Self Care | Source: Home / Self Care | Attending: Emergency Medicine | Admitting: Emergency Medicine

## 2024-09-15 DIAGNOSIS — W540XXA Bitten by dog, initial encounter: Secondary | ICD-10-CM | POA: Diagnosis not present

## 2024-09-15 DIAGNOSIS — S01511A Laceration without foreign body of lip, initial encounter: Secondary | ICD-10-CM | POA: Diagnosis not present

## 2024-09-15 DIAGNOSIS — S0993XA Unspecified injury of face, initial encounter: Secondary | ICD-10-CM | POA: Diagnosis present

## 2024-09-15 MED ORDER — AMOXICILLIN-POT CLAVULANATE 875-125 MG PO TABS: 1.0000 | ORAL_TABLET | Freq: Two times a day (BID) | ORAL | 0 refills | Status: AC

## 2024-09-15 MED ORDER — AMOXICILLIN-POT CLAVULANATE 875-125 MG PO TABS
1.0000 | ORAL_TABLET | Freq: Once | ORAL | Status: AC
  Administered 2024-09-15: 1 via ORAL
  Filled 2024-09-15: qty 1

## 2024-09-15 MED ORDER — ONDANSETRON 4 MG PO TBDP
4.0000 mg | ORAL_TABLET | Freq: Once | ORAL | Status: DC
  Filled 2024-09-15: qty 1

## 2024-09-15 MED ORDER — LIDOCAINE HCL (PF) 1 % IJ SOLN
10.0000 mL | Freq: Once | INTRAMUSCULAR | Status: AC
  Administered 2024-09-15: 10 mL via INTRADERMAL
  Filled 2024-09-15: qty 10

## 2024-09-15 MED ORDER — OXYCODONE HCL 5 MG PO TABS
5.0000 mg | ORAL_TABLET | Freq: Once | ORAL | Status: DC
  Filled 2024-09-15: qty 1

## 2024-09-15 MED ORDER — LIDOCAINE-PRILOCAINE 2.5-2.5 % EX CREA
TOPICAL_CREAM | Freq: Once | CUTANEOUS | Status: AC
  Administered 2024-09-15: 1 via TOPICAL
  Filled 2024-09-15: qty 5

## 2024-09-15 NOTE — ED Provider Notes (Signed)
 "  [facility] Provider Note    Event Date/Time   First MD Initiated Contact with Patient 09/15/24 0505     (approximate)   History   Lip Laceration   HPI  Larry Alcock is a 64 y.o. male who presented to the emergency department today with concern for laceration to his lip.  The patient states that his dog bit him in the lip as he was trying to get him off of the bed.  His dog is up-to-date on his vaccines.  He denies any other injury.  Patient states that he had his tetanus shot last year.     Physical Exam   Triage Vital Signs: ED Triage Vitals  Encounter Vitals Group     BP 09/15/24 0121 (!) 155/81     Girls Systolic BP Percentile --      Girls Diastolic BP Percentile --      Boys Systolic BP Percentile --      Boys Diastolic BP Percentile --      Pulse Rate 09/15/24 0121 69     Resp 09/15/24 0121 18     Temp 09/15/24 0121 98 F (36.7 C)     Temp Source 09/15/24 0547 Oral     SpO2 09/15/24 0121 100 %     Weight 09/15/24 0121 130 lb (59 kg)     Height 09/15/24 0121 5' 7 (1.702 m)     Head Circumference --      Peak Flow --      Pain Score 09/15/24 0121 2     Pain Loc --      Pain Education --      Exclude from Growth Chart --     Most recent vital signs: Vitals:   09/15/24 0121 09/15/24 0547  BP: (!) 155/81 135/70  Pulse: 69 65  Resp: 18 16  Temp: 98 F (36.7 C) 98.1 F (36.7 C)  SpO2: 100% 100%   General: Awake, alert, oriented. CV:  Good peripheral perfusion. Resp:  Normal effort.  Abd:  No distention.  Other:  Roughly 1.5 cm laceration to right upper lip   ED Results / Procedures / Treatments   Labs (all labs ordered are listed, but only abnormal results are displayed) Labs Reviewed - No data to display   EKG  None   RADIOLOGY None  PROCEDURES:  Critical Care performed: No  Procedures  LACERATION REPAIR Performed by: Guadalupe Eagles Authorized by: Guadalupe Eagles Consent: Verbal consent  obtained. Risks and benefits: risks, benefits and alternatives were discussed Consent given by: patient Patient identity confirmed: provided demographic data Prepped and Draped in normal sterile fashion Wound explored  Laceration Location: right upper lip  Laceration Length: 1.5 cm  No Foreign Bodies seen or palpated  Anesthesia: local infiltration  Local anesthetic: lidocaine  1% without epinephrine  Anesthetic total: 2 ml  Skin closure: 5-0 vicryl rapide  Number of sutures: 5  Technique: simple interrupted  Patient tolerance: Patient tolerated the procedure well with no immediate complications.   MEDICATIONS ORDERED IN ED: Medications  oxyCODONE  (Oxy IR/ROXICODONE ) immediate release tablet 5 mg (5 mg Oral Not Given 09/15/24 0548)  ondansetron  (ZOFRAN -ODT) disintegrating tablet 4 mg (4 mg Oral Not Given 09/15/24 0549)  lidocaine  (PF) (XYLOCAINE ) 1 % injection 10 mL (10 mLs Intradermal Given by Other 09/15/24 0547)  amoxicillin -clavulanate (AUGMENTIN ) 875-125 MG per tablet 1 tablet (1 tablet Oral Given 09/15/24 0305)  lidocaine -prilocaine  (EMLA ) cream (1 Application Topical Given 09/15/24 0320)  IMPRESSION / MDM / ASSESSMENT AND PLAN / ED COURSE  I reviewed the triage vital signs and the nursing notes.                              Differential diagnosis includes, but is not limited to, laceration  Patient's presentation is most consistent with acute presentation with potential threat to life or bodily function.   Patient presented to the emergency department today because of concerns for right upper lip laceration from a dog bite.  Patient was given antibiotics here.  Laceration was sutured closed.  Discussed care with patient.  Will discharge with prescription for antibiotics.     FINAL CLINICAL IMPRESSION(S) / ED DIAGNOSES   Final diagnoses:  Lip laceration, initial encounter  Dog bite, initial encounter        Rx / DC Orders   ED Discharge Orders           Ordered    amoxicillin -clavulanate (AUGMENTIN ) 875-125 MG tablet  2 times daily        09/15/24 0536             Note:  This document was prepared using Dragon voice recognition software and may include unintentional dictation errors.    Floy Roberts, MD 09/15/24 [phone]  "

## 2024-09-15 NOTE — ED Triage Notes (Signed)
 Pt presents for lip laceration approx 20 min ago. Pt states his dog hit him in the face (unsure if the dog bit him or his tooth when through his lip. Bleeding controlled. Dog is UTD on all vaccinations.

## 2025-06-15 ENCOUNTER — Encounter: Payer: Self-pay | Admitting: Internal Medicine
